# Patient Record
Sex: Male | Born: 1980 | Race: White | Hispanic: No | Marital: Married
Health system: Southern US, Community
[De-identification: ages and names within clinical notes are randomized; demographics above are authoritative.]

## PROBLEM LIST (undated history)

## (undated) DIAGNOSIS — F419 Anxiety disorder, unspecified: Secondary | ICD-10-CM

## (undated) DIAGNOSIS — E119 Type 2 diabetes mellitus without complications: Secondary | ICD-10-CM

## (undated) DIAGNOSIS — I10 Essential (primary) hypertension: Secondary | ICD-10-CM

## (undated) DIAGNOSIS — F319 Bipolar disorder, unspecified: Secondary | ICD-10-CM

## (undated) DIAGNOSIS — F32A Depression, unspecified: Secondary | ICD-10-CM

## (undated) HISTORY — PX: APPENDECTOMY: SHX54

## (undated) HISTORY — PX: COLONOSCOPY: SHX174

## (undated) HISTORY — DX: Anxiety disorder, unspecified: F41.9

## (undated) HISTORY — PX: SHOULDER ARTHROSCOPY: SHX128

## (undated) HISTORY — PX: CHOLECYSTECTOMY: SHX55

## (undated) HISTORY — PX: ARTHROSCOPIC REPAIR ACL: SUR80

## (undated) HISTORY — DX: Depression, unspecified: F32.A

---

## 2021-07-08 ENCOUNTER — Encounter (HOSPITAL_COMMUNITY): Payer: Self-pay

## 2021-07-08 ENCOUNTER — Emergency Department (HOSPITAL_COMMUNITY): Payer: Self-pay

## 2021-07-08 ENCOUNTER — Other Ambulatory Visit: Payer: Self-pay

## 2021-07-08 ENCOUNTER — Emergency Department (HOSPITAL_COMMUNITY)
Admission: EM | Admit: 2021-07-08 | Discharge: 2021-07-08 | Disposition: A | Discharge status: Home / Self Care | Payer: Self-pay | Attending: Emergency Medicine | Admitting: Emergency Medicine

## 2021-07-08 DIAGNOSIS — M25511 Pain in right shoulder: Secondary | ICD-10-CM | POA: Insufficient documentation

## 2021-07-08 DIAGNOSIS — T148XXA Other injury of unspecified body region, initial encounter: Secondary | ICD-10-CM

## 2021-07-08 DIAGNOSIS — W540XXA Bitten by dog, initial encounter: Secondary | ICD-10-CM | POA: Insufficient documentation

## 2021-07-08 DIAGNOSIS — S61212A Laceration without foreign body of right middle finger without damage to nail, initial encounter: Secondary | ICD-10-CM | POA: Insufficient documentation

## 2021-07-08 DIAGNOSIS — Z23 Encounter for immunization: Secondary | ICD-10-CM | POA: Insufficient documentation

## 2021-07-08 DIAGNOSIS — G8929 Other chronic pain: Secondary | ICD-10-CM | POA: Insufficient documentation

## 2021-07-08 MED ORDER — TETANUS-DIPHTH-ACELL PERTUSSIS 5-2.5-18.5 LF-MCG/0.5 IM SUSY
0.5000 mL | PREFILLED_SYRINGE | Freq: Once | INTRAMUSCULAR | Status: AC
  Administered 2021-07-08: 0.5 mL via INTRAMUSCULAR
  Filled 2021-07-08: qty 0.5

## 2021-07-08 MED ORDER — AMOXICILLIN-POT CLAVULANATE 875-125 MG PO TABS: 1.0000 | ORAL_TABLET | Freq: Two times a day (BID) | ORAL | 0 refills | Status: DC

## 2021-07-08 MED ORDER — LIDOCAINE HCL (PF) 1 % IJ SOLN
5.0000 mL | Freq: Once | INTRAMUSCULAR | Status: AC
  Administered 2021-07-08: 5 mL
  Filled 2021-07-08: qty 30

## 2021-07-08 NOTE — ED Provider Notes (Signed)
[facility]-EMERGENCY DEPT Provider Note   CSN: [id number] Arrival date & time: 07/08/21  1958     History Chief Complaint  Patient presents with   Laceration   Animal Bite    Jonathon Warren is a 40 y.o. male with a past medical history who presents for evaluation of laceration.  Occurred just PTA.  Located to right middle finger.  Occurred as he was trying to break up his dogs which were fighting.  He has #2 lacerations.  No active bleeding.  No paresthesias, warm.  Full range of motion.  Last tetanus greater than 80 years old.  Dog is up-to-date on rabies.  In addition he also states he has had some pain to his right shoulder over the last 3 weeks after getting injection.  No fever, redness, warmth.  Pain worse with movement.  No midline neck pain, no traumatic injury.  Denies additional aggravating or alleviating factors.  History obtained from patient and past medical records.  No interpreter used  HPI     History reviewed. No pertinent past medical history.  There are no problems to display for this patient.   History reviewed    History reviewed. No pertinent family history.     Home Medications Prior to Admission medications   Medication Sig Start Date End Date Taking? Authorizing Provider  amoxicillin-clavulanate (AUGMENTIN) 875-125 MG tablet Take 1 tablet by mouth every 12 (twelve) hours. 07/08/21  Yes Ellaina Schuler A, PA-C    Allergies    Patient has no allergy information on record.  Review of Systems   Review of Systems  Constitutional: Negative.   HENT: Negative.    Respiratory: Negative.    Cardiovascular: Negative.   Gastrointestinal: Negative.   Genitourinary: Negative.   Musculoskeletal: Negative.        Right shoulder pain  Skin:  Positive for wound.  Neurological: Negative.   All other systems reviewed and are negative.  Physical Exam Updated Vital Signs BP (!) 138/93 (BP Location: Left Arm)   Pulse 79   Temp 98.6  F (37 C) (Oral)   Resp 15   Ht 6\' 1"  (1.854 m)   Wt 131.5 kg   SpO2 97%   BMI 38.26 kg/m   Physical Exam Vitals and nursing note reviewed.  Constitutional:      General: He is not in acute distress.    Appearance: He is well-developed. He is not ill-appearing, toxic-appearing or diaphoretic.  HENT:     Head: Atraumatic.  Eyes:     Pupils: Pupils are equal, round, and reactive to light.  Cardiovascular:     Rate and Rhythm: Normal rate and regular rhythm.     Pulses:          Radial pulses are 2+ on the right side and 2+ on the left side.  Pulmonary:     Effort: Pulmonary effort is normal. No respiratory distress.  Abdominal:     General: There is no distension.     Palpations: Abdomen is soft.  Musculoskeletal:        General: Tenderness (right shoudler pain with ROM) present. Normal range of motion.     Cervical back: Normal range of motion and neck supple.     Comments: Full rom digits, decreased rom right shoulder secondary to pain  Skin:    General: Skin is warm and dry.     Comments: 2 cm gapping laceration between MCP and PIP, gapping. 1 cm closed laceration to lateral aspect  third digit. No active bleeding  Neurological:     General: No focal deficit present.     Mental Status: He is alert and oriented to person, place, and time.     Sensory: No sensory deficit.     Motor: No weakness.    ED Results / Procedures / Treatments   Labs (all labs ordered are listed, but only abnormal results are displayed) Labs Reviewed - No data to display  EKG None  Radiology DG Shoulder Right  Result Date: 07/08/2021 CLINICAL DATA:  Dog bite. EXAM: RIGHT SHOULDER - 2+ VIEW COMPARISON:  None. FINDINGS: There is no evidence of fracture or dislocation. There is no evidence of arthropathy or other focal bone abnormality. There is no radiopaque foreign body. Soft tissues are unremarkable. IMPRESSION: Negative. Electronically Signed   By: Darliss Cheney M.D.   On: 07/08/2021 20:58    DG Finger Middle Right  Result Date: 07/08/2021 CLINICAL DATA:  Dog bite. EXAM: RIGHT MIDDLE FINGER 2+V COMPARISON:  None. FINDINGS: There is no evidence of fracture or dislocation. There is no evidence of arthropathy or other focal bone abnormality. There is no radiopaque foreign body. Soft tissues are unremarkable. IMPRESSION: Negative. Electronically Signed   By: Darliss Cheney M.D.   On: 07/08/2021 20:58    Procedures .Marland KitchenLaceration Repair  Date/Time: 07/08/2021 9:59 PM Performed by: Ralph Leyden A, PA-C Authorized by: Linwood Dibbles, PA-C   Consent:    Consent obtained:  Verbal   Consent given by:  Patient   Risks, benefits, and alternatives were discussed: yes     Risks discussed:  Infection, pain, retained foreign body, tendon damage, vascular damage, poor wound healing, poor cosmetic result, need for additional repair and nerve damage   Alternatives discussed:  No treatment, delayed treatment, observation and referral Universal protocol:    Procedure explained and questions answered to patient or proxy's satisfaction: yes     Relevant documents present and verified: yes     Test results available: yes     Imaging studies available: yes     Required blood products, implants, devices, and special equipment available: yes     Site/side marked: yes     Immediately prior to procedure, a time out was called: yes     Patient identity confirmed:  Verbally with patient Anesthesia:    Anesthesia method:  Local infiltration   Local anesthetic:  Lidocaine 1% w/o epi Laceration details:    Location:  Finger   Length (cm):  2   Depth (mm):  4 Pre-procedure details:    Preparation:  Patient was prepped and draped in usual sterile fashion and imaging obtained to evaluate for foreign bodies Exploration:    Limited defect created (wound extended): no     Hemostasis achieved with:  Direct pressure   Imaging obtained: x-ray     Imaging outcome: foreign body not noted     Wound  exploration: wound explored through full range of motion and entire depth of wound visualized     Wound extent: no fascia violation noted, no foreign bodies/material noted, no muscle damage noted, no nerve damage noted, no tendon damage noted, no underlying fracture noted and no vascular damage noted     Contaminated: no   Treatment:    Area cleansed with:  Povidone-iodine   Amount of cleaning:  Extensive   Irrigation solution:  Sterile saline   Irrigation method:  Pressure wash Skin repair:    Repair method:  Sutures   Suture size:  4-0   Suture material:  Prolene   Suture technique:  Simple interrupted   Number of sutures:  2 Approximation:    Approximation:  Loose Repair type:    Repair type:  Intermediate Post-procedure details:    Dressing:  Non-adherent dressing   Procedure completion:  Tolerated well, no immediate complications   Medications Ordered in ED Medications  Tdap (BOOSTRIX) injection 0.5 mL (0.5 mLs Intramuscular Given 07/08/21 2052)  lidocaine (PF) (XYLOCAINE) 1 % injection 5 mL (5 mLs Infiltration Given by Other 07/08/21 2053)   ED Course  I have reviewed the triage vital signs and the nursing notes.  Pertinent labs & imaging results that were available during my care of the patient were reviewed by me and considered in my medical decision making (see chart for details).  Here for evaluation of dog bite to third digit RUE volar surface, gapping 2 cm, no active bleeding. Additional 1 cm, approximated laceration lateral aspect finger.  Imaging obtained does not show evidence of retained foreign object.  He has full range of motion, neurovascularly intact.  Low suspicion for acute bony, tendon, ligament, muscle injury.  Due to gaping area and laceration placed to, loose sutures to allow for drainage.  Wounds were thoroughly irrigated prior.  We will keep second like open to allow for drainage given contaminated dog bite.  Patient also does note some left shoulder  pain over the last 3 weeks after getting shot.  Pain worse with movement.  No fevers, overlying redness, warmth, no numbness or weakness.  X-ray here does not show evidence of fracture, dislocation or effusion.  Low suspicion for gout, septic joint, hemarthrosis.  We will have him follow-up with Ortho outpatient.  He is agreeable.   MDM Rules/Calculators/A&P                            Final Clinical Impression(s) / ED Diagnoses Final diagnoses:  Animal bite  Laceration of right middle finger without foreign body without damage to nail, initial encounter  Chronic right shoulder pain    Rx / DC Orders ED Discharge Orders          Ordered    amoxicillin-clavulanate (AUGMENTIN) 875-125 MG tablet  Every 12 hours        07/08/21 2145             Jonathandavid Marlett A, PA-C 07/08/21 2202    Maia Plan, MD 07/08/21 2222

## 2021-07-08 NOTE — ED Triage Notes (Signed)
Pt reports with a laceration/dog bite to his right middle finger. Pt was breaking up his dogs fighting and was bitten around 1600.

## 2021-07-08 NOTE — Discharge Instructions (Addendum)
It was our pleasure taking care of you here in the emergency department.  Make sure to keep your wounds clean for the first 24 hours, after this may use warm soapy water to rinse over these  Follow-up with orthopedics for her shoulder.  I have placed the contact number to one in your discharge paperwork.  Call to schedule an appointment

## 2021-08-23 ENCOUNTER — Encounter (HOSPITAL_COMMUNITY): Payer: Self-pay | Admitting: Registered Nurse

## 2021-08-23 ENCOUNTER — Other Ambulatory Visit (HOSPITAL_COMMUNITY)
Admission: EM | Admit: 2021-08-23 | Discharge: 2021-08-24 | Disposition: A | Discharge status: Home / Self Care | Payer: PRIVATE HEALTH INSURANCE | Attending: Psychiatry | Admitting: Psychiatry

## 2021-08-23 ENCOUNTER — Other Ambulatory Visit: Payer: Self-pay

## 2021-08-23 DIAGNOSIS — F410 Panic disorder [episodic paroxysmal anxiety] without agoraphobia: Secondary | ICD-10-CM | POA: Insufficient documentation

## 2021-08-23 DIAGNOSIS — Z20822 Contact with and (suspected) exposure to covid-19: Secondary | ICD-10-CM | POA: Diagnosis not present

## 2021-08-23 DIAGNOSIS — F3160 Bipolar disorder, current episode mixed, unspecified: Secondary | ICD-10-CM | POA: Diagnosis present

## 2021-08-23 LAB — RESP PANEL BY RT-PCR (FLU A&B, COVID) ARPGX2
Influenza A by PCR: NEGATIVE
Influenza B by PCR: NEGATIVE
SARS Coronavirus 2 by RT PCR: NEGATIVE

## 2021-08-23 LAB — CBC WITH DIFFERENTIAL/PLATELET
Abs Immature Granulocytes: 0.03 10*3/uL (ref 0.00–0.07)
Basophils Absolute: 0 10*3/uL (ref 0.0–0.1)
Basophils Relative: 0 %
Eosinophils Absolute: 0.2 10*3/uL (ref 0.0–0.5)
Eosinophils Relative: 2 %
HCT: 41.6 % (ref 39.0–52.0)
Hemoglobin: 14.2 g/dL (ref 13.0–17.0)
Immature Granulocytes: 0 %
Lymphocytes Relative: 30 %
Lymphs Abs: 2.9 10*3/uL (ref 0.7–4.0)
MCH: 28.8 pg (ref 26.0–34.0)
MCHC: 34.1 g/dL (ref 30.0–36.0)
MCV: 84.4 fL (ref 80.0–100.0)
Monocytes Absolute: 0.7 10*3/uL (ref 0.1–1.0)
Monocytes Relative: 7 %
Neutro Abs: 5.9 10*3/uL (ref 1.7–7.7)
Neutrophils Relative %: 61 %
Platelets: 290 10*3/uL (ref 150–400)
RBC: 4.93 MIL/uL (ref 4.22–5.81)
RDW: 11.9 % (ref 11.5–15.5)
WBC: 9.9 10*3/uL (ref 4.0–10.5)
nRBC: 0 % (ref 0.0–0.2)

## 2021-08-23 LAB — URINALYSIS, ROUTINE W REFLEX MICROSCOPIC
Bilirubin Urine: NEGATIVE
Glucose, UA: NEGATIVE mg/dL
Hgb urine dipstick: NEGATIVE
Ketones, ur: 40 mg/dL — AB
Leukocytes,Ua: NEGATIVE
Nitrite: NEGATIVE
Protein, ur: NEGATIVE mg/dL
Specific Gravity, Urine: 1.02 (ref 1.005–1.030)
pH: 6.5 (ref 5.0–8.0)

## 2021-08-23 LAB — COMPREHENSIVE METABOLIC PANEL
ALT: 61 U/L — ABNORMAL HIGH (ref 0–44)
AST: 26 U/L (ref 15–41)
Albumin: 3.8 g/dL (ref 3.5–5.0)
Alkaline Phosphatase: 37 U/L — ABNORMAL LOW (ref 38–126)
Anion gap: 9 (ref 5–15)
BUN: 27 mg/dL — ABNORMAL HIGH (ref 6–20)
CO2: 26 mmol/L (ref 22–32)
Calcium: 9.2 mg/dL (ref 8.9–10.3)
Chloride: 100 mmol/L (ref 98–111)
Creatinine, Ser: 1.1 mg/dL (ref 0.61–1.24)
GFR, Estimated: >60 mL/min (ref >60)
Glucose, Bld: 160 mg/dL — ABNORMAL HIGH (ref 70–99)
Potassium: 3.6 mmol/L (ref 3.5–5.1)
Sodium: 135 mmol/L (ref 135–145)
Total Bilirubin: 0.6 mg/dL (ref 0.3–1.2)
Total Protein: 6.6 g/dL (ref 6.5–8.1)

## 2021-08-23 LAB — LIPID PANEL
Cholesterol: 132 mg/dL (ref 0–200)
HDL: 36 mg/dL — ABNORMAL LOW (ref >40)
LDL Cholesterol: 46 mg/dL (ref 0–99)
Total CHOL/HDL Ratio: 3.7 RATIO
Triglycerides: 248 mg/dL — ABNORMAL HIGH (ref <150)
VLDL: 50 mg/dL — ABNORMAL HIGH (ref 0–40)

## 2021-08-23 LAB — HEMOGLOBIN A1C
Hgb A1c MFr Bld: 6.7 % — ABNORMAL HIGH (ref 4.8–5.6)
Mean Plasma Glucose: 145.59 mg/dL

## 2021-08-23 LAB — POCT URINE DRUG SCREEN - MANUAL ENTRY (I-SCREEN)
POC Amphetamine UR: NOT DETECTED
POC Buprenorphine (BUP): NOT DETECTED
POC Cocaine UR: NOT DETECTED
POC Marijuana UR: NOT DETECTED
POC Methadone UR: NOT DETECTED
POC Methamphetamine UR: NOT DETECTED
POC Morphine: NOT DETECTED
POC Oxazepam (BZO): NOT DETECTED
POC Oxycodone UR: NOT DETECTED
POC Secobarbital (BAR): NOT DETECTED

## 2021-08-23 LAB — ETHANOL: Alcohol, Ethyl (B): <10 mg/dL (ref <10)

## 2021-08-23 LAB — POC SARS CORONAVIRUS 2 AG: SARSCOV2ONAVIRUS 2 AG: NEGATIVE

## 2021-08-23 LAB — MAGNESIUM: Magnesium: 1.7 mg/dL (ref 1.7–2.4)

## 2021-08-23 LAB — TSH: TSH: 1.789 u[IU]/mL (ref 0.350–4.500)

## 2021-08-23 MED ORDER — MAGNESIUM HYDROXIDE 400 MG/5ML PO SUSP: 30.0000 mL | Freq: Every day | ORAL | Status: DC | PRN

## 2021-08-23 MED ORDER — HYDROXYZINE HCL 25 MG PO TABS
25.0000 mg | ORAL_TABLET | Freq: Three times a day (TID) | ORAL | Status: DC | PRN
  Administered 2021-08-23 – 2021-08-24 (×2): 25 mg via ORAL
  Filled 2021-08-23 (×2): qty 1

## 2021-08-23 MED ORDER — ALBUTEROL SULFATE HFA 108 (90 BASE) MCG/ACT IN AERS: 2.0000 | INHALATION_SPRAY | Freq: Four times a day (QID) | RESPIRATORY_TRACT | Status: DC | PRN

## 2021-08-23 MED ORDER — LOSARTAN POTASSIUM-HCTZ 100-25 MG PO TABS: 1.0000 | ORAL_TABLET | Freq: Every day | ORAL | Status: DC

## 2021-08-23 MED ORDER — HYDROCHLOROTHIAZIDE 25 MG PO TABS
25.0000 mg | ORAL_TABLET | Freq: Every day | ORAL | Status: DC
  Administered 2021-08-24: 25 mg via ORAL
  Filled 2021-08-23: qty 1

## 2021-08-23 MED ORDER — ACETAMINOPHEN 325 MG PO TABS
650.0000 mg | ORAL_TABLET | Freq: Four times a day (QID) | ORAL | Status: DC | PRN
  Administered 2021-08-24: 650 mg via ORAL
  Filled 2021-08-23: qty 2

## 2021-08-23 MED ORDER — BUSPIRONE HCL 5 MG PO TABS: 10.0000 mg | ORAL_TABLET | Freq: Two times a day (BID) | ORAL | Status: DC | PRN

## 2021-08-23 MED ORDER — TRAZODONE HCL 100 MG PO TABS
200.0000 mg | ORAL_TABLET | Freq: Every day | ORAL | Status: DC
  Administered 2021-08-23: 200 mg via ORAL
  Filled 2021-08-23: qty 2

## 2021-08-23 MED ORDER — METOPROLOL TARTRATE 50 MG PO TABS
50.0000 mg | ORAL_TABLET | Freq: Two times a day (BID) | ORAL | Status: DC
  Administered 2021-08-23 – 2021-08-24 (×2): 50 mg via ORAL
  Filled 2021-08-23 (×2): qty 1

## 2021-08-23 MED ORDER — ALUM & MAG HYDROXIDE-SIMETH 200-200-20 MG/5ML PO SUSP: 30.0000 mL | ORAL | Status: DC | PRN

## 2021-08-23 MED ORDER — LOSARTAN POTASSIUM 50 MG PO TABS
100.0000 mg | ORAL_TABLET | Freq: Every day | ORAL | Status: DC
  Administered 2021-08-24: 100 mg via ORAL
  Filled 2021-08-23: qty 2

## 2021-08-23 MED ORDER — PAROXETINE HCL 20 MG PO TABS
20.0000 mg | ORAL_TABLET | Freq: Two times a day (BID) | ORAL | Status: DC
Start: 2021-08-23 — End: 2021-08-24
  Administered 2021-08-23 – 2021-08-24 (×2): 20 mg via ORAL
  Filled 2021-08-23 (×2): qty 1

## 2021-08-23 MED ORDER — METFORMIN HCL 500 MG PO TABS
1000.0000 mg | ORAL_TABLET | Freq: Every day | ORAL | Status: DC
  Administered 2021-08-23: 1000 mg via ORAL
  Filled 2021-08-23: qty 2

## 2021-08-23 MED ORDER — AMLODIPINE BESYLATE 5 MG PO TABS
5.0000 mg | ORAL_TABLET | Freq: Every day | ORAL | Status: DC
Start: 2021-08-24 — End: 2021-08-24
  Administered 2021-08-24: 5 mg via ORAL
  Filled 2021-08-23: qty 1

## 2021-08-23 MED ORDER — CLONAZEPAM 1 MG PO TABS
1.0000 mg | ORAL_TABLET | Freq: Once | ORAL | Status: AC
  Administered 2021-08-23: 1 mg via ORAL
  Filled 2021-08-23: qty 1

## 2021-08-23 NOTE — BH Assessment (Signed)
Comprehensive Clinical Assessment (CCA) Note  08/23/2021 Jonathon Warren [id number]  Disposition: Per [name], NP admission to Facility Based Crisis unit is recommended for crisis stabilization.   The patient demonstrates the following risk factors for suicide: Chronic risk factors for suicide include: psychiatric disorder of Bipolar I Disorder and chronic pain. Acute risk factors for suicide include: N/A. Protective factors for this patient include: positive social support, responsibility to others (children, family), coping skills, hope for the future, and life satisfaction. Considering these factors, the overall suicide risk at this point appears to be low. Patient is appropriate for outpatient follow up.  Patient is a 41 year old male with a history of Bipolar I Disorder who presents voluntarily with his wife to [facility] for assessment.  He reports extreme anxiety and panic attacks for the past few days.  He is diagnosed with Bipolar and and is Rx Lamictal, Paxil, Buspar, Klonopin and Trazadone.  He is medication  compliant and medications are managed by his PCP with [facility].  He had been seeing a psychiatrist while living in [place].  When he moved, his PCP was willing to continue medications as previously prescribed. Patient missed a few lamictal doses, due to insurance issues with refilling.  He has since been back on it for the past few days.  He has not had an episode like this since his admission to [facility] 7 years ago.  He struggles to identify a trigger, outside of work related stress.  He is an Therapist, sports at [employer].  Patient is SOB and quite anxious, asking his wife to answer questions as needed. Patient is feeling overwhelmed and describes feeling "terrified" at the idea of going back to work.   Patient is fearful that this episode will worsen to the point his last episode got to. This fear has been "crippling" and he is struggling to function.   Patient denies SI, HI and AVH.  He denies SA hx.  He is open to treatment recommendiatons, to include inpatient treatment.  Patient's wife is very supportive and provides some collateral.  She confirms that patient missed a few days of mood stabiizer, however she feels there trigger may have been related to patient having a needle break in his arm when flu shot was administered at work in Nov 2022.  He has since had related pain and frustrated that his employer isn't considering workers compensation for the injury.  As a result, he has not had this piece of the needle removed, resulting in limited mobility with right arm due to related pain.  His wife has noticed worsening symptoms with significant anxiety and mood lability since.  She is supportive of treatment recommendations.    Chief Complaint: No chief complaint on file.  Visit Diagnosis: Bipolar I Disorder  Flowsheet Row ED from 08/23/2021 in [facility]  Thoughts that you would be better off dead, or of hurting yourself in some way Not at all  PHQ-9 Total Score 11      [place] ED from 07/08/2021 in [place] DEPT  C-SSRS RISK CATEGORY No Risk        CCA Screening, Triage and Referral (STR)  Patient Reported Information How did you hear about [name]? Family/Friend  What Is the Reason for Your Visit/Call Today? Patient presents with wife for assessment.  He reports extreme anxiety and panic attacks for the past few days.  He is diagnosed with Bipolar and and is  Rx Lamictal.  He was off for a couple of days, but has been back on it for the past few days.  He has not had an episode like this since his admission to [facility] 7 years ago.  Patient is SOB and quite anxious, asking his wife to answer questions as needed. Patient is feeling overwhelmed and describes feeling "terrified" at the idea of going back to work.  He is an Therapist, sports at [employer].  Patient is  fearful that this episode will worsen to the point his last episode got to. This fear has been "crippling" and he is struggling to function.  Patient denies SI, HI and AVH.  He denies SA hx.  He is open to treatment recommendiatons, to include inpatient.  How Long Has This Been Causing You Problems? <Week  What Do You Feel Would Help You the Most Today? Treatment for Depression or other mood problem   Have You Recently Had Any Thoughts About Hurting Yourself? No  Are You Planning to Commit Suicide/Harm Yourself At This time? No   Have you Recently Had Thoughts About [place]? No  Are You Planning to Harm Someone at This Time? No  Explanation: No data recorded  Have You Used Any Alcohol or Drugs in the Past 24 Hours? No  How Long Ago Did You Use Drugs or Alcohol? No data recorded What Did You Use and How Much? No data recorded  Do You Currently Have a Therapist/Psychiatrist? No  Name of Therapist/Psychiatrist: No data recorded  Have You Been Recently Discharged From Any Office Practice or Programs? No  Explanation of Discharge From Practice/Program: No data recorded    CCA Screening Triage Referral Assessment Type of Contact: Face-to-Face  Telemedicine Service Delivery:   Is this Initial or Reassessment? No data recorded Date Telepsych consult ordered in [facility]:  No data recorded Time Telepsych consult ordered in [facility]:  No data recorded Location of Assessment: [facility] Assessment Services  Provider Location: [facility] Assessment Services   Collateral Involvement: Wife provided collateral   Does Patient Have a Newport? No data recorded Name and Contact of Legal Guardian: No data recorded If Minor and Not Living with Parent(s), Who has Custody? No data recorded Is CPS involved or ever been involved? Never  Is APS involved or ever been involved? Never   Patient Determined To Be At Risk for Harm To Self or Others Based on Review of Patient  Reported Information or Presenting Complaint? No  Method: No data recorded Availability of Means: No data recorded Intent: No data recorded Notification Required: No data recorded Additional Information for Danger to Others Potential: No data recorded Additional Comments for Danger to Others Potential: No data recorded Are There Guns or Other Weapons in Your Home? No data recorded Types of Guns/Weapons: No data recorded Are These Weapons Safely Secured?                            No data recorded Who Could Verify You Are Able To Have These Secured: No data recorded Do You Have any Outstanding Charges, Pending Court Dates, Parole/Probation? No data recorded Contacted To Inform of Risk of Harm To Self or Others: No data recorded   Does Patient Present under Involuntary Commitment? No  IVC Papers Initial File Date: No data recorded  [place] of Residence: [place]   Patient Currently Receiving the Following Services: Not Receiving Services  Determination of Need: Urgent (48 hours)   Options For Referral: Intensive Outpatient Therapy; Inpatient Hospitalization; Medication Management; Outpatient Therapy     CCA Biopsychosocial Patient Reported Schizophrenia/Schizoaffective Diagnosis in Past: No   Strengths: Seeking treatment, has support   Mental Health Symptoms Depression:   Difficulty Concentrating; Fatigue; Hopelessness; Worthlessness; Increase/decrease in appetite; Sleep (too much or little)   Duration of Depressive symptoms:  Duration of Depressive Symptoms: Less than two weeks   Mania:   Irritability; Racing thoughts   Anxiety:    Restlessness; Sleep; Tension; Worrying; Fatigue; Difficulty concentrating; Irritability   Psychosis:   None   Duration of Psychotic symptoms:    Trauma:   None   Obsessions:   None   Compulsions:   None   Inattention:   N/A   Hyperactivity/Impulsivity:   N/A   Oppositional/Defiant Behaviors:   N/A   Emotional  Irregularity:   Mood lability; Chronic feelings of emptiness   Other Mood/Personality Symptoms:  No data recorded   Mental Status Exam Appearance and self-care  Stature:   Average   Weight:   Average weight   Clothing:   Casual   Grooming:   Normal   Cosmetic use:   None   Posture/gait:   Tense   Motor activity:   Restless   Sensorium  Attention:   Distractible; Normal   Concentration:   Anxiety interferes; Variable   Orientation:   X5   Recall/memory:   Normal   Affect and Mood  Affect:   Depressed; Labile   Mood:   Irritable; Negative; Depressed; Anxious   Relating  Eye contact:   Fleeting   Facial expression:  No data recorded  Attitude toward examiner:  No data recorded  Thought and Language  Speech flow:  Clear and Coherent; Paucity; Pressured   Thought content:   Appropriate to Mood and Circumstances   Preoccupation:   None   Hallucinations:   None   Organization:  No data recorded  [employer] of Knowledge:   Average   Intelligence:   Average   Abstraction:   Normal   Judgement:   Fair   Reality Testing:   Adequate   Insight:   Gaps   Decision Making:   Impulsive; Vacilates; Normal   Social Functioning  Social Maturity:   Responsible   Social Judgement:   Normal   Stress  Stressors:   Work   Coping Ability:   Exhausted; Overwhelmed   Skill Deficits:   Interpersonal; Responsibility; Self-control; Communication   Supports:   Family; Friends/Service system     Religion: Religion/Spirituality Are You A Religious Person?: No  Leisure/Recreation: Leisure / Recreation Do You Have Hobbies?: No  Exercise/Diet: Exercise/Diet Do You Exercise?: No Have You Gained or Lost A Significant Amount of Weight in the Past Six Months?: No Do You Follow a Special Diet?: No Do You Have Any Trouble Sleeping?: Yes Explanation of Sleeping Difficulties: Unable to estimate, however states he isn't  sleeping well even with trazadone   CCA Employment/Education Employment/Work Situation:    Education: Education Is Patient Currently Attending School?: No Last Grade Completed: 16 Did You Attend College?: Yes What Type of College Degree Do you Have?: Patient has a nursing degree Did You Have An Individualized Education Program (IIEP): No Did You Have Any Difficulty At School?: No Patient's Education Has Been Impacted by Current Illness: No   CCA Family/Childhood History Family and Relationship History: Family history Marital status: Married Number of Years Married:  (  NA) What types of issues is patient dealing with in the relationship?: No concerns noted Additional relationship information: NA Does patient have children?: Yes How many children?:  (NA - has kids and step-children)  Childhood History:  Childhood History By whom was/is the patient raised?: Mother, Father Did patient suffer any verbal/emotional/physical/sexual abuse as a child?: No Did patient suffer from severe childhood neglect?: No Has patient ever been sexually abused/assaulted/raped as an adolescent or adult?: No Was the patient ever a victim of a crime or a disaster?: No Witnessed domestic violence?: No Has patient been affected by domestic violence as an adult?: No  Child/Adolescent Assessment:     CCA Substance Use Alcohol/Drug Use: Alcohol / Drug Use Pain Medications: See MAR Prescriptions: See MAR Over the Counter: See MAR History of alcohol / drug use?: No history of alcohol / drug abuse                         ASAM's:  Six Dimensions of Multidimensional Assessment  Dimension 1:  Acute Intoxication and/or Withdrawal Potential:      Dimension 2:  Biomedical Conditions and Complications:      Dimension 3:  Emotional, Behavioral, or Cognitive Conditions and Complications:     Dimension 4:  Readiness to Change:     Dimension 5:  Relapse, Continued use, or Continued Problem  Potential:     Dimension 6:  Recovery/Living Environment:     ASAM Severity Score:    ASAM Recommended Level of Treatment:     Substance use Disorder (SUD)    Recommendations for Services/Supports/Treatments:    Discharge Disposition:    DSM5 Diagnoses: Patient Active Problem List   Diagnosis Date Noted   Bipolar I disorder, most recent episode mixed ([place]) 08/23/2021   Bipolar affective disorder, current episode mixed, without psychotic features ([place]) 08/23/2021     Referrals to Alternative Service(s):  [facility], [facility]

## 2021-08-23 NOTE — ED Notes (Signed)
Pt is currently sleeping, no distress noted, environmental check complete, will continue to monitor patient for safety. ? ?

## 2021-08-23 NOTE — Progress Notes (Signed)
Patient admitted to obs unit until COVID test results. Patient  is pleasant, logical in speech and thought. Patient denies SI, HI and AVH. Skin is WNL, only several tattoos present on right arm and left leg. Nursing staff will continue to monitor.

## 2021-08-23 NOTE — ED Notes (Signed)
Pt is in the dining room attending the AA meeting that is taking place

## 2021-08-23 NOTE — ED Notes (Signed)
Pts medication have been given and patient is now in bed

## 2021-08-23 NOTE — Progress Notes (Addendum)
Pt was transferred from [facility]. Pt is admitted to [facility] due to anxiety and panic attacks. Pt is alert and oriented with a blunted affect. Pt is ambulatory and is oriented to staff and unit. Pt was cooperative with admission process and skin assessment. Pt complained of back and arm pain on a scale of 7. Pt denies current SI/HI/AVH. 15 minutes checks for safety initiated. Staff will monitor for pt's safety.

## 2021-08-23 NOTE — ED Notes (Signed)
Pt. Spoke to me about transferring to another treatment center and requested to speak with a social worker in the AM. About the switch. I told him to ask for after 8AM.

## 2021-08-23 NOTE — ED Notes (Signed)
Pt in dayroom attending AA meeting

## 2021-08-23 NOTE — Progress Notes (Signed)
°   08/23/21 0806  [facility] Triage Screening (Walk-ins at [facility] only)  How Did You Hear About [name]? Family/Friend  What Is the Reason for Your Visit/Call Today? Patient presents with wife for assessment.  He reports extreme anxiety and panic attacks for the past few days.  He is diagnosed with Bipolar and and is Rx Lamictal.  He was off for a couple of days, but has been back on it for the past few days.  He has not had an episode like this since his admission to [facility] 7 years ago.  Patient is SOB and quite anxious, asking his wife to answer questions as needed. Patient is feeling overwhelmed and describes feeling "terrified" at the idea of going back to work.  He is an Charity fundraiser at [employer]  Patient is fearful that this episode will worsen to the point his last episode got to. This fear has been "crippling" and he is struggling to function.  Patient denies SI, HI and AVH.  He denies SA hx.  He is open to treatment recommendiatons, to include inpatient.  How Long Has This Been Causing You Problems? <Week  Have You Recently Had Any Thoughts About Hurting Yourself? No  Are You Planning to Commit Suicide/Harm Yourself At This time? No  Have you Recently Had Thoughts About Hurting Someone Jonathon Warren? No  Are You Planning To Harm Someone At This Time? No  Are you currently experiencing any auditory, visual or other hallucinations? No  Have You Used Any Alcohol or Drugs in the Past 24 Hours? No  Do you have any current medical co-morbidities that require immediate attention? No  Clinician description of patient physical appearance/behavior: Patient is anxious, eyes closed during deep breathing, overall coopertive  What Do You Feel Would Help You the Most Today? Treatment for Depression or other mood problem  If access to [facility] was not available, would you have sought care in the Emergency Department? No  Determination of Need Urgent (48 hours)  Options For Referral Intensive Outpatient  Therapy;Inpatient Hospitalization;Medication Management;Outpatient Therapy

## 2021-08-23 NOTE — ED Provider Notes (Signed)
Behavioral Health Admission [facility] & OBS)  Date: 08/23/21 Patient Name: Jonathon Warren MRN: [id number] Chief Complaint: No chief complaint on file.     Diagnoses:  Final diagnoses:  Bipolar I disorder, most recent episode mixed [facility])    HPI: Jonathon Warren, 41 y.o., male patient presents to [facility] as a walk-in accompanied by his wife with complaints of anxiety and panic attacks that are worsening of the last few days.  Patient seen face to face by this provider, consulted with Dr. Ernie Hew; and chart reviewed on 08/23/21.  On evaluation Jonathon Warren reports psychiatric history of bipolar disorder.  Reports medication management is done by his primary care doctor at this time; states he does not have any outpatient psychiatric services currently.  Patient reports he missed a couple of days of his psychotropic medications but has been back on his regular schedule for the last couple of days.  Patient reports he has been feeling very anxious, having manic thoughts, crippling fear, and panic attacks.  Reports he feels as if the incident where a needle was broken off in his arm while obtaining a flu shot and then being turned down by [employer]. is what triggered this episode.  Stating he is now terrified to go back to work and has a panic attack when he thinks about going back.  Patient denies suicidal/self-harm/homicidal ideation, and psychosis.  He also denies paranoia, stating that the fear he has toward work he would really say it was paranoia.  Patient reports the last time he felt like this he was admitted to [facility] 7 years ago and is wanting to avoid getting any worse.  Patient is sennoside his wife and both of them seem very co dependent upon each other.  Patient feels he needs psychiatric hospitalization for stabilization but is afraid to leave his wife stating he is needed to keep her stable.  Patient reporting only 1 psychiatric hospitalization 7 years ago.  Denies prior self  harming behaviors or suicide attempt. During evaluation Jonathon Warren is sitting upright in chair in no acute distress.  He is alert/oriented x 4; calm/cooperative.  His mood is labile; 1 minute laughing joking the next that depressed and tearful.  He is speaking in a clear tone at moderate volume, and normal pace; with good eye contact.  His thought process is coherent and relevant; There is no indication that he is currently responding to internal/external stimuli or experiencing delusional thought content; and he denies suicidal/self-harm/homicidal ideation, psychosis, and paranoia.  He does endorse racing thoughts and feeling somewhat manic; also states has not slept well for the last couple of days.  PHQ 2-9:   Flowsheet Row ED from 07/08/2021 in [place] DEPT  C-SSRS RISK CATEGORY No Risk        Total Time spent with patient: 30 minutes  Musculoskeletal  Strength & Muscle Tone: within normal limits Gait & Station: normal Patient leans: N/A  Psychiatric Specialty Exam  Presentation General Appearance: Appropriate for Environment  Eye Contact:Good  Speech:Clear and Coherent; Normal Rate  Speech Volume:Normal  Handedness:Right   Mood and Affect  Mood:Anxious; Labile  Affect:Depressed   Thought Process  Thought Processes:Coherent; Goal Directed  Descriptions of Associations:Intact  Orientation:Full (Time, Place and Person)  Thought Content:Logical    Hallucinations:Hallucinations: None  Ideas of Reference:None  Suicidal Thoughts:Suicidal Thoughts: No  Homicidal Thoughts:Homicidal Thoughts: No   Sensorium  Memory:Immediate Good; Recent Good; Remote Good  Judgment:Intact  Insight:Present   Executive  Functions  Concentration:Good  Attention Span:Good  Recall:Good  Fund of Knowledge:Good  Language:Good   Psychomotor Activity  Psychomotor Activity:Psychomotor Activity: Normal   Assets  Assets:Communication  Skills; Desire for Improvement; Financial Resources/Insurance; Housing; Social Support; Transportation   Sleep  Sleep:Sleep: Fair   Nutritional Assessment (For OBS and [facility] admissions only) Has the patient had a weight loss or gain of 10 pounds or more in the last 3 months?: No Has the patient had a decrease in food intake/or appetite?: No Does the patient have dental problems?: No Does the patient have eating habits or behaviors that may be indicators of an eating disorder including binging or inducing vomiting?: No Has the patient been eating poorly because of a decreased appetite?: 0    Physical Exam ROS  Blood pressure (!) 125/95, pulse 93, temperature (!) 97 F (36.1 C), temperature source Oral, resp. rate 18, height 6\' 1"  (1.854 m), weight 131.5 kg, SpO2 99 %. Body mass index is 38.26 kg/m.  Past Psychiatric History: Bipolar disorder, Anxiety   Is the patient at risk to self? No  Has the patient been a risk to self in the past 6 months? No .    Has the patient been a risk to self within the distant past? No   Is the patient a risk to others? No   Has the patient been a risk to others in the past 6 months? No   Has the patient been a risk to others within the distant past? No   Past Medical History: History reviewed. No pertinent past medical history. History reviewed. No pertinent surgical history.  Family History: History reviewed. No pertinent family history.  Social History:  Social History   Socioeconomic History   Marital status: Married    Spouse name: Not on file   Number of children: Not on file   Years of education: Not on file   Highest education level: Not on file  Occupational History   Not on file  Tobacco Use   Smoking status: Not on file   Smokeless tobacco: Not on file  Substance and Sexual Activity   Alcohol use: Not on file   Drug use: Not on file   Sexual activity: Not on file  Other Topics Concern   Not on file  Social History Narrative    Not on file   Social Determinants of Health   Financial Resource Strain: Not on file  Food Insecurity: Not on file  Transportation Needs: Not on file  Physical Activity: Not on file  Stress: Not on file  Social Connections: Not on file  Intimate Partner Violence: Not on file    SDOH:  SDOH Screenings   Alcohol Screen: Not on file  Depression (PHQ2-9): Not on file  Financial Resource Strain: Not on file  Food Insecurity: Not on file  Housing: Not on file  Physical Activity: Not on file  Social Connections: Not on file  Stress: Not on file  Tobacco Use: Not on file  Transportation Needs: Not on file    Last Labs:  No results found for any previous visit.    Allergies: Patient has no allergy information on record.  PTA Medications: (Not in a hospital admission)   Medical Decision Making  Admitted to facility based crisis unit for safety and stabilization Lab Orders         Resp Panel by RT-PCR (Flu A&B, Covid) Nasopharyngeal Swab         CBC with Differential/Platelet  Comprehensive metabolic panel         Hemoglobin A1c         Magnesium         Ethanol         Lipid panel         TSH         Urinalysis, Routine w reflex microscopic Urine, Clean Catch         POC SARS Coronavirus 2 Ag-ED - Nasal Swab         POCT Urine Drug Screen - (ICup)      Medication Management:  Restart home medications after pharmacy med reconciliation completed.   Social work to assist with setting up outpatient psychiatric follow up services   Recommendations  Based on my evaluation the patient does not appear to have an emergency medical condition.  Raygen Dahm, NP 08/23/21  9:24 AM

## 2021-08-24 LAB — GLUCOSE, CAPILLARY: Glucose-Capillary: 152 mg/dL — ABNORMAL HIGH (ref 70–99)

## 2021-08-24 MED ORDER — QUETIAPINE FUMARATE 100 MG PO TABS: 100.0000 mg | ORAL_TABLET | Freq: Every day | ORAL | 0 refills | Status: DC

## 2021-08-24 MED ORDER — LAMOTRIGINE 25 MG PO TABS: 25.0000 mg | ORAL_TABLET | Freq: Every day | ORAL | Status: DC

## 2021-08-24 MED ORDER — QUETIAPINE FUMARATE 100 MG PO TABS: 100.0000 mg | ORAL_TABLET | Freq: Every day | ORAL | Status: DC

## 2021-08-24 MED ORDER — CLONAZEPAM 1 MG PO TABS
1.0000 mg | ORAL_TABLET | Freq: Three times a day (TID) | ORAL | Status: DC | PRN
  Administered 2021-08-24: 1 mg via ORAL
  Filled 2021-08-24: qty 1

## 2021-08-24 NOTE — Progress Notes (Signed)
Pt was seen by provider multiple times. Pt came to the NS whilst this writer was speaking with the provider to inquire about his Klonopin. Pt was informed by provider and this writer that the medication is PRN. This Clinical research associate further explained that pt will have to ask for PRN. Pt then became hostile, raised his voice and said "I asked for it this morning." This writer had to reiterate that medication was not ordered when he asked for it. Pt was also informed that he was given Vistaril in the morning when the Klonopin was not available. Pt became irate and said " you should have given it to me when it was ordered.' Pt was informed again that PRNs are as needed. Administered PRN Klonopin with no further incident. Will continue to monitor.

## 2021-08-24 NOTE — Progress Notes (Signed)
Pt is awake, alert and oriented. Pt is irritable and labile. Pt complained of back and arm pain. PRN Tylenol ,Vistaril  and scheduled meds administered with no incident. No signs of acute distress noted. Pt has been visible on the milieu and made multiple phone calls. Pt denies current SI/HI/AVH.  Staff will monitor for pt's safety.

## 2021-08-24 NOTE — ED Notes (Signed)
While medication was being administered the pt asked if he could have the medications he takes at home which are klonopin, trazodone, his blood pressure medications and some other medications. The patient was advised that we do not see those medications listed in his MAR. The patient stated he has been here since this morning and he told the pharmacist this morning what medications he is on. This nurse along with Sharyn Lull (LPN) advised the patient that we see the medications listed under the home meds list however they have to be put in his [facility] by the provider and reconciled by pharmacy. 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 The patient was advised that we will reach out to the provider to have him to get the medications ordered for him.

## 2021-08-24 NOTE — Discharge Summary (Addendum)
Jonathon Warren to be D/C'd Home per MD order. Discussed with the patient and all questions fully answered. An After Visit Summary was printed and given to the patient. Pt's belongings were given to him. Patient escorted out and D/C home via private auto.  [name]  08/24/2021 5:00 PM

## 2021-08-24 NOTE — Discharge Instructions (Signed)

## 2021-08-24 NOTE — ED Provider Notes (Signed)
[facility]/OBS ASAP Discharge Summary  Date and Time: 08/24/2021 4:41 PM  Name: Jonathon Warren  MRN:  [id number]   Discharge Diagnoses:  Final diagnoses:  Bipolar I disorder, most recent episode mixed ([place])    Subjective:  Patient seen and chart reviewed.  Patient seen in conjunction today with Education officer, museum.  On chart review patient was requesting to go to a different facility.  Patient interviewed this morning.  Patient states he has been experiencing increased anxiety and panic attacks.  Patient indicates that this is similar to what he was experiencing approximately 7 to 8 years ago when he was diagnosed with bipolar disorder and admitted to [facility].  Patient states that prior to this admission that he went to the ED several times and they were not able to get his medication right and that he was ultimately admitted to [facility] and then went on disability.  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  Patient states that minor things "them.  Me" and that he will go into a state where he would hyperventilate and become tearful.  He denies SI/HI/AVH.  Patient indicates that he was initially upset since he did not have his medications ordered yesterday evening which resulted in him feeling as though he was not getting the best care.  Patient states that he would like to be admitted to an inpatient facility since the [facility] does not have visitation and it is not as structured as an inpatient unit.  Discussed with patient that currently there are are no criteria for an inpatient admission; however, should he stay at the [facility] for higher level of care is indicated that recommendation can be made after period of time.  Patient verbalized understanding and was agreeable to medication adjustments-to discontinue Lamictal  and to start Seroquel 100 mg nightly.  Later in the day, patient  inquired  into other possible options as he did not see [facility] is a good fit..  Discussed the length option of PHP with patient and after consideration he felt that this would better suit his needs and requested discharge and prescription for Seroquel.  Prior to discharge collateral was obtained from wife Kiah Homeyer.  Wife shared that patient has been increasingly anxious and depressed and having panic attacks although denied concerns for [facility] safety and had no safety concerns if he were to be discharged.  Stay Summary:  Jonathon Warren, 41 y.o., male patient presents to [facility] as a walk-in accompanied by his wife on 1/11 with complaints of anxiety and panic attacks that are worsening of the last few days.On evaluation Jonathon Warren reports psychiatric history of bipolar disorder.  Reports medication management is done by his primary care doctor at this time; states he does not have any outpatient psychiatric services currently.  Patient reports he missed a couple of days of his psychotropic medications but has been back on his regular schedule for the last couple of days.  Patient reports he has been feeling very anxious, having manic thoughts, crippling fear, and panic attacks.  Reports he feels as if the incident where a needle was broken off in his arm while obtaining a flu shot and then being turned down by [employer]. is what triggered this episode.  Stating he is now terrified to go back to work  and has a panic attack when he thinks about going back.  Patient denies suicidal/self-harm/homicidal ideation, and psychosis.  He also denies paranoia, stating that the fear he has toward work he would really say it was paranoia.  Patient reports the last time he felt like this he was admitted to [facility] 7 years ago and is wanting to avoid getting any worse.  Patient is sennoside his wife and both of them seem very co dependent upon each  other.  Patient feels he needs psychiatric hospitalization for stabilization but is afraid to leave his wife stating he is needed to keep her stable.  Patient reporting only 1 psychiatric hospitalization 7 years ago.  Denies prior self harming behaviors or suicide attempt. During evaluation Jonathon Warren is sitting upright in chair in no acute distress.  He is alert/oriented x 4; calm/cooperative.  His mood is labile; 1 minute laughing joking the next that depressed and tearful.  He is speaking in a clear tone at moderate volume, and normal pace; with good eye contact.  His thought process is coherent and relevant; There is no indication that he is currently responding to internal/external stimuli or experiencing delusional thought content; and he denies suicidal/self-harm/homicidal ideation, psychosis, and paranoia.  He does endorse racing thoughts and feeling somewhat manic; also states has not slept well for the last couple of days.  Patient was continued on home medications and admitted to [facility]. See above for interview day of discharge. Patient requested discharge ultimately with script for seroquel and referral for PHP  Total Time spent with patient: 1 hour  Past Psychiatric History: bipolar diosrder Past Medical History: History reviewed. No pertinent past medical history. History reviewed. No pertinent surgical history. Family History: History reviewed. No pertinent family history. Family Psychiatric History: mother with bipolar disorder Social History:  Social History   Substance and Sexual Activity  Alcohol Use None     Social History   Substance and Sexual Activity  Drug Use Not on file    Social History   Socioeconomic History   Marital status: Married    Spouse name: Not on file   Number of children: Not on file   Years of education: Not on file   Highest education level: Not on file  Occupational History   Not on file  Tobacco Use   Smoking status: Not on file   Smokeless  tobacco: Not on file  Substance and Sexual Activity   Alcohol use: Not on file   Drug use: Not on file   Sexual activity: Not on file  Other Topics Concern   Not on file  Social History Narrative   Not on file   Social Determinants of Health   Financial Resource Strain: Not on file  Food Insecurity: Not on file  Transportation Needs: Not on file  Physical Activity: Not on file  Stress: Not on file  Social Connections: Not on file   SDOH:  SDOH Screenings   Alcohol Screen: Not on file  Depression (PHQ2-9): Medium Risk   PHQ-2 Score: 11  Financial Resource Strain: Not on file  Food Insecurity: Not on file  Housing: Not on file  Physical Activity: Not on file  Social Connections: Not on file  Stress: Not on file  Tobacco Use: Not on file  Transportation Needs: Not on file    Tobacco Cessation:  N/A, patient does not currently use tobacco products  Current Medications:  Current Facility-Administered Medications  Medication Dose Route Frequency Provider Last Rate  Last Admin   acetaminophen (TYLENOL) tablet 650 mg  650 mg Oral Q6H PRN Rankin, Shuvon B, NP   650 mg at 08/24/21 0902   albuterol (VENTOLIN HFA) 108 (90 Base) MCG/ACT inhaler 2 puff  2 puff Inhalation Q6H PRN Lindon Romp A, NP       alum & mag hydroxide-simeth (MAALOX/MYLANTA) 200-200-20 MG/5ML suspension 30 mL  30 mL Oral Q4H PRN Rankin, Shuvon B, NP       amLODipine (NORVASC) tablet 5 mg  5 mg Oral Daily Lindon Romp A, NP   5 mg at 08/24/21 0858   busPIRone (BUSPAR) tablet 10 mg  10 mg Oral BID PRN Rozetta Nunnery, NP       clonazePAM (KLONOPIN) tablet 1 mg  1 mg Oral TID PRN Ival Bible, MD   1 mg at 08/24/21 1445   losartan (COZAAR) tablet 100 mg  100 mg Oral Daily Ival Bible, MD   100 mg at 08/24/21 [id number]   And   hydrochlorothiazide (HYDRODIURIL) tablet 25 mg  25 mg Oral Daily Ival Bible, MD   25 mg at 08/24/21 [id number]   hydrOXYzine (ATARAX) tablet 25 mg  25 mg Oral TID PRN Rankin,  Shuvon B, NP   25 mg at 08/24/21 0859   lamoTRIgine (LAMICTAL) tablet 25 mg  25 mg Oral QHS Ival Bible, MD       magnesium hydroxide (MILK OF MAGNESIA) suspension 30 mL  30 mL Oral Daily PRN Rankin, Shuvon B, NP       metFORMIN (GLUCOPHAGE) tablet 1,000 mg  1,000 mg Oral QHS Lindon Romp A, NP   1,000 mg at 08/23/21 2200   metoprolol tartrate (LOPRESSOR) tablet 50 mg  50 mg Oral BID Lindon Romp A, NP   50 mg at 08/24/21 0858   PARoxetine (PAXIL) tablet 20 mg  20 mg Oral BID Lindon Romp A, NP   20 mg at 08/24/21 0858   QUEtiapine (SEROQUEL) tablet 100 mg  100 mg Oral QHS Ival Bible, MD       traZODone (DESYREL) tablet 200 mg  200 mg Oral QHS Lindon Romp A, NP   200 mg at 08/23/21 2200   Current Outpatient Medications  Medication Sig Dispense Refill   albuterol (VENTOLIN HFA) 108 (90 Base) MCG/ACT inhaler Inhale 2 puffs into the lungs every 6 (six) hours as needed for wheezing or shortness of breath.     amLODipine (NORVASC) 5 MG tablet Take 5 mg by mouth daily.     busPIRone (BUSPAR) 10 MG tablet Take 10 mg by mouth 2 (two) times daily as needed (For anxiety).     clonazePAM (KLONOPIN) 1 MG tablet Take 1 mg by mouth in the morning and at bedtime.     ibuprofen (ADVIL) 200 MG tablet Take 800 mg by mouth in the morning and at bedtime.     lamoTRIgine 50 MG TBDP Take 50 mg by mouth at bedtime.     losartan-hydrochlorothiazide (HYZAAR) 100-25 MG tablet Take 1 tablet by mouth daily.     melatonin 5 MG TABS Take 5-10 mg by mouth at bedtime.     metFORMIN (GLUCOPHAGE) 1000 MG tablet Take 1,000 mg by mouth at bedtime.     metoprolol tartrate (LOPRESSOR) 50 MG tablet Take 50 mg by mouth in the morning and at bedtime.     Multiple Vitamins-Minerals (AIRBORNE GUMMIES PO) Take 2 tablets by mouth daily as needed (For immune health).     PARoxetine (PAXIL)  20 MG tablet Take 20 mg by mouth in the morning and at bedtime.     traZODone (DESYREL) 100 MG tablet Take 200 mg by mouth at  bedtime.     QUEtiapine (SEROQUEL) 100 MG tablet Take 1 tablet (100 mg total) by mouth at bedtime. 30 tablet 0    PTA Medications: (Not in a hospital admission)   Musculoskeletal  Strength & Muscle Tone: within normal limits Gait & Station: normal Patient leans: N/A  Psychiatric Specialty Exam  Presentation  General Appearance: Appropriate for Environment; Casual  Eye Contact:Good  Speech:Clear and Coherent; Normal Rate  Speech Volume:Normal  Handedness:Right   Mood and Affect  Mood:Dysphoric; Depressed; Anxious  Affect:Appropriate; Congruent; Constricted   Thought Process  Thought Processes:Coherent; Goal Directed; Linear  Descriptions of Associations:Intact  Orientation:Full (Time, Place and Person)  Thought Content:Logical; WDL  Diagnosis of Schizophrenia or Schizoaffective disorder in past: No    Hallucinations:Hallucinations: None  Ideas of Reference:None  Suicidal Thoughts:Suicidal Thoughts: No  Homicidal Thoughts:Homicidal Thoughts: No   Sensorium  Memory:Immediate Good; Recent Good; Remote Good  Judgment:Good  Insight:Good   Executive Functions  Concentration:Good  Attention Span:Good  [place] of Knowledge:Good  Language:Good   Psychomotor Activity  Psychomotor Activity:Psychomotor Activity: Normal   Assets  Assets:Communication Skills; Desire for Improvement; Financial Resources/Insurance; Housing; Intimacy; Social Support; Resilience; Physical Health; Talents/Skills   Sleep  Sleep:Sleep: Fair   Nutritional Assessment (For OBS and [facility]BC admissions only) Has the patient had a weight loss or gain of 10 pounds or more in the last 3 months?: No Has the patient had a decrease in food intake/or appetite?: No Does the patient have dental problems?: No Does the patient have eating habits or behaviors that may be indicators of an eating disorder including binging or inducing vomiting?: No Has the patient been eating poorly  because of a decreased appetite?: 0    Physical Exam  Physical Exam Constitutional:      Appearance: Normal appearance. He is obese.  HENT:     Head: Normocephalic and atraumatic.  Eyes:     Extraocular Movements: Extraocular movements intact.  Pulmonary:     Effort: Pulmonary effort is normal.  Neurological:     General: No focal deficit present.     Mental Status: He is alert and oriented to person, place, and time.   Review of Systems  Constitutional:  Negative for chills and fever.  HENT:  Negative for hearing loss.   Eyes:  Negative for discharge and redness.  Respiratory:  Negative for cough.   Cardiovascular:  Negative for chest pain.  Gastrointestinal:  Negative for abdominal pain.  Musculoskeletal:  Negative for myalgias.  Neurological:  Negative for headaches.  Psychiatric/Behavioral:  Positive for depression. Negative for hallucinations, substance abuse and suicidal ideas. The patient is nervous/anxious and has insomnia.   Blood pressure 134/89, pulse 90, temperature 97.9 F (36.6 C), temperature source Oral, resp. rate 18, height 6\' 1"  (1.854 m), weight 131.5 kg, SpO2 100 %. Body mass index is 38.26 kg/m.  Demographic Factors:  Male and Caucasian  Loss Factors: NA  Historical Factors: Family history of mental illness or substance abuse and Impulsivity  Risk Reduction Factors:   Sense of responsibility to family, Religious beliefs about death, Employed, Living with another person, especially a relative, and Positive social support  Continued Clinical Symptoms:  Severe Anxiety and/or Agitation Panic Attacks Bipolar Disorder:   Depressive phase Previous Psychiatric Diagnoses and Treatments  Cognitive Features That Contribute To Risk:  Thought constriction (tunnel vision)    Suicide Risk:  Minimal: No identifiable suicidal ideation.  Patients presenting with no risk factors but with morbid ruminations; may be classified as minimal risk based on the  severity of the depressive symptoms  Plan Of Care/Follow-up recommendations:  Activity:  as tolerated Diet:  diabetic diet Other:    Take all medications as prescribed by his/her mental healthcare provider. Report any adverse effects and or reactions from the medicines to your outpatient provider promptly. Do not engage in alcohol and or illegal drug use while on prescription medicines. In the event of worsening symptoms, call the crisis hotline, 911 and or go to the nearest ED for appropriate evaluation and treatment of symptoms. follow-up with your primary care provider for your other medical issues, concerns and or health care needs.  Allergies as of 08/24/2021       Reactions   Lisinopril Other (See Comments)   "Makes me have to clear my throat a lot"        Medication List     TAKE these medications    AIRBORNE GUMMIES PO Take 2 tablets by mouth daily as needed (For immune health).   albuterol 108 (90 Base) MCG/ACT inhaler Commonly known as: VENTOLIN HFA Inhale 2 puffs into the lungs every 6 (six) hours as needed for wheezing or shortness of breath.   amLODipine 5 MG tablet Commonly known as: NORVASC Take 5 mg by mouth daily.   busPIRone 10 MG tablet Commonly known as: BUSPAR Take 10 mg by mouth 2 (two) times daily as needed (For anxiety).   clonazePAM 1 MG tablet Commonly known as: KLONOPIN Take 1 mg by mouth in the morning and at bedtime.   ibuprofen 200 MG tablet Commonly known as: ADVIL Take 800 mg by mouth in the morning and at bedtime.   lamoTRIgine 50 MG Tbdp Take 50 mg by mouth at bedtime.   losartan-hydrochlorothiazide 100-25 MG tablet Commonly known as: HYZAAR Take 1 tablet by mouth daily.   melatonin 5 MG Tabs Take 5-10 mg by mouth at bedtime.   metFORMIN 1000 MG tablet Commonly known as: GLUCOPHAGE Take 1,000 mg by mouth at bedtime.   metoprolol tartrate 50 MG tablet Commonly known as: LOPRESSOR Take 50 mg by mouth in the morning and at  bedtime.   PARoxetine 20 MG tablet Commonly known as: PAXIL Take 20 mg by mouth in the morning and at bedtime.   QUEtiapine 100 MG tablet Commonly known as: SEROQUEL Take 1 tablet (100 mg total) by mouth at bedtime.   traZODone 100 MG tablet Commonly known as: DESYREL Take 200 mg by mouth at bedtime.       Patient advised to stop Lamictal and to start quetiapine 100 mg nightly.  Patient advised to follow up with PHP referral.  Disposition: home, self care  Ival Bible, MD 08/24/2021, 4:41 PM

## 2021-08-24 NOTE — Progress Notes (Signed)
SPIRITUALITY GROUP NOTE  Spirituality group facilitated by Simone Curia, MDiv, [place].  Group Description: Group focused on topic of hope. Patients participated in facilitated discussion around topic, connecting with one another around experiences and definitions for hope. Group members engaged with visual explorer photos, reflecting on what hope looks like for them today. Group engaged in discussion around how their definitions of hope are present today in hospital.  Modalities: Psycho-social ed, Adlerian, Narrative, MI  Patient Progress: Purav did not attend group

## 2021-08-24 NOTE — Progress Notes (Signed)
Pt is asleep. Respirations are even and unlabored. No distress noted. Monitoring for pt's safety.

## 2021-08-24 NOTE — ED Notes (Signed)
Pt sleeping@this time. Breathing even and unlabored. Will continue to monitor for safety 

## 2021-08-24 NOTE — Clinical Social Work Psych Note (Signed)
LCSW Initial/Discharge Note  LCSW and Dr. Serafina Mitchell, MD met with patient for introduction and to begin discussions regarding treatment and possible discharge planning.   Prior to engaging with Arnaldo Natal and MD were notified that Stran requested to be transferred to another facility due to him believing his needs were not being met on the [facility].   Upon approach, Ozie presented with euthymic affect, depressed mood. Trevell shared that he came to the [facility] with his wife, [name] Borunda [phone]) seeking assistance for extreme anxiety and panic attacks for the past few days. Addison shared that he believes he is experiencing an increase in anxiety symptoms due to concerns with feeling overwhelmed and "terrified" at the idea of going back to work.  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   Nehemias shared that he is not sure why he became anxious about returning to work. He reports his last episode of increased anxiety and panic attacks was 7 years ago, when he received inpatient services at [facility] in [place], [place].   Dr. Serafina Mitchell, MD spoke with Corene Cornea about potential medication changes. Lorry was agreeable with trying an alternative medication, however he continued to express that he does not believe his needs were being met on the [facility]. Hobie shared that he was expecting more "programming and therapy services" versus  crisis stabilization services. LCSW and MD spoke with Corene Cornea throughout the day about the differences between the different levels of care and which level of care we were recommending at this time.   LCSW and MD shared with Aime that at this time, he did not meet criteria for inpatient psychiatric treatment services. LCSW and MD explained that if he did not believe his needs were being met in this particular setting,  that he had the option to request discharge with appropriate follow up. Tajai expressed understanding and was able to agree to follow up with an intensive level of outpatient services.    LCSW referred patient to [facility] with Randie Heinz, due to the patient having private insurance.   Colden reports he plans to discharge home with his wife and other family members. He reports his wife will pick him up at discharge.    CSW will continue to follow until discharge.    Radonna Ricker, MSW, LCSW Clinical Education officer, museum ([place]) [facility]

## 2021-08-24 NOTE — ED Notes (Signed)
Pt eating breakfast 

## 2021-08-30 ENCOUNTER — Other Ambulatory Visit (HOSPITAL_COMMUNITY): Payer: PRIVATE HEALTH INSURANCE | Attending: Psychiatry | Admitting: Professional

## 2021-08-30 ENCOUNTER — Other Ambulatory Visit: Payer: Self-pay

## 2021-08-30 DIAGNOSIS — F411 Generalized anxiety disorder: Secondary | ICD-10-CM | POA: Insufficient documentation

## 2021-08-30 DIAGNOSIS — Z79899 Other long term (current) drug therapy: Secondary | ICD-10-CM | POA: Insufficient documentation

## 2021-08-30 DIAGNOSIS — F3132 Bipolar disorder, current episode depressed, moderate: Secondary | ICD-10-CM | POA: Insufficient documentation

## 2021-08-31 ENCOUNTER — Other Ambulatory Visit (HOSPITAL_COMMUNITY): Payer: PRIVATE HEALTH INSURANCE | Admitting: Licensed Clinical Social Worker

## 2021-08-31 ENCOUNTER — Other Ambulatory Visit (HOSPITAL_COMMUNITY): Payer: PRIVATE HEALTH INSURANCE

## 2021-08-31 ENCOUNTER — Other Ambulatory Visit: Payer: Self-pay

## 2021-08-31 DIAGNOSIS — F411 Generalized anxiety disorder: Secondary | ICD-10-CM | POA: Diagnosis not present

## 2021-08-31 DIAGNOSIS — Z79899 Other long term (current) drug therapy: Secondary | ICD-10-CM | POA: Diagnosis not present

## 2021-08-31 DIAGNOSIS — F3132 Bipolar disorder, current episode depressed, moderate: Secondary | ICD-10-CM | POA: Diagnosis present

## 2021-08-31 NOTE — Psych (Signed)
Virtual Visit via Video Note  I connected with Jonathon Warren on 08/30/21 at  3:00 PM EST by a video enabled telemedicine application and verified that I am speaking with the correct person using two identifiers.  Location: Patient: Home Provider: Clinical Home office   I discussed the limitations of evaluation and management by telemedicine and the availability of in person appointments. The patient expressed understanding and agreed to proceed.  Follow Up Instructions:    I discussed the assessment and treatment plan with the patient. The patient was provided an opportunity to ask questions and all were answered. The patient agreed with the plan and demonstrated an understanding of the instructions.   The patient was advised to call back or seek an in-person evaluation if the symptoms worsen or if the condition fails to improve as anticipated.  I provided 60 minutes of non-face-to-face time during this encounter.   Jonathon Warren, [facility]    Comprehensive Clinical Assessment (CCA) Note  08/30/2021 Jonathon Warren [id number]  Chief Complaint:  Chief Complaint  Patient presents with   Depression   Anxiety   Follow-up   Visit Diagnosis: BP    CCA Screening, Triage and Referral (STR)  Patient Reported Information How did you hear about [name]? Hospital Discharge  Referral name: [facility]  Referral phone number: No data recorded  Whom do you see for routine medical problems? Primary Care  Practice/Facility [facility]: [name]  Practice/Facility Phone Number: No data recorded Name of Contact: No data recorded Contact Number: No data recorded Contact Fax Number: No data recorded Prescriber Name: No data recorded Prescriber Address (if known): No data recorded  What Is the Reason for Your Visit/Call Today? anxiety; bipolar; depression  How Long Has This Been Causing You Problems? > than 6 months  What Do You Feel Would Help You the Most Today? Treatment for Depression or  other mood problem   Have You Recently Been in Any Inpatient Treatment (Hospital/Detox/Crisis Center/28-Day Program)? Yes  Name/Location of Program/Hospital:[facility]  How Long Were You There? 1 day  When Were You Discharged? 08/24/21   Have You Ever Received Services From [employer] Before? Yes  Who Do You See at [facility]? No data recorded  Have You Recently Had Any Thoughts About Hurting Yourself? No  Are You Planning to Commit Suicide/Harm Yourself At This time? No   Have you Recently Had Thoughts About [place]? No  Explanation: No data recorded  Have You Used Any Alcohol or Drugs in the Past 24 Hours? No  How Long Ago Did You Use Drugs or Alcohol? No data recorded What Did You Use and How Much? No data recorded  Do You Currently Have a Therapist/Psychiatrist? No  Name of Therapist/Psychiatrist: No data recorded  Have You Been Recently Discharged From Any Office Practice or Programs? No  Explanation of Discharge From Practice/Program: No data recorded    CCA Screening Triage Referral Assessment Type of Contact: Tele-Assessment  Is this Initial or Reassessment? Initial Assessment  Date Telepsych consult ordered in [facility]:  No data recorded Time Telepsych consult ordered in [facility]:  No data recorded  Patient Reported Information Reviewed? No data recorded Patient Left Without Being Seen? No data recorded Reason for Not Completing Assessment: No data recorded  Collateral Involvement: notes   Does Patient Have a Felt? No data recorded Name and Contact of Legal Guardian: No data recorded If Minor and Not Living with Parent(s), Who has Custody? No data recorded Is CPS  involved or ever been involved? Never  Is APS involved or ever been involved? Never   Patient Determined To Be At Risk for Harm To Self or Others Based on Review of Patient Reported Information or Presenting Complaint? No  Method: No data  recorded Availability of Means: No data recorded Intent: No data recorded Notification Required: No data recorded Additional Information for Danger to Others Potential: No data recorded Additional Comments for Danger to Others Potential: No data recorded Are There Guns or Other Weapons in Your Home? No data recorded Types of Guns/Weapons: No data recorded Are These Weapons Safely Secured?                            No data recorded Who Could Verify You Are Able To Have These Secured: No data recorded Do You Have any Outstanding Charges, Pending Court Dates, Parole/Probation? No data recorded Contacted To Inform of Risk of Harm To Self or Others: No data recorded  Location of Assessment: Other (comment)   Does Patient Present under Involuntary Commitment? No  IVC Papers Initial File Date: No data recorded  [place] of Residence: [place]   Patient Currently Receiving the Following Services: Medication Management   Determination of Need: Urgent (48 hours)   Options For Referral: Partial Hospitalization     CCA Biopsychosocial Intake/Chief Complaint:  Pt reports per [facility]. Pts wife is present for CCA at patient request. Pt reports he woke up last Wednesday and had a major panic attack, feelings of guilt, feelings of worthlessness. Pt reports agitation and anxiety around others. Pt reports paranoia, including thinking wife is cheating, though pt reports he knows these things are not true. 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 Pt reports he had to go home early on Monday 1/9 and called out Tuesday 1/10. Pt is concerned about how job will react. Pt is concerned 12hr shifts are too much for him and would like 8hr shifts. 2) Family: My wife and youngest dont get along and that stresses me out. 3) Flu Shot: Pt reports he has a piece of metal stuck in his arm due to a flu shot and workers comp denied him. 4) Back pain: Pt reports he caught a  resident when falling a few years ago and my back slipped out.  Pt reports the last time he had increased panic attacks and felt this way was 7-8years ago when he was hospitalized at [facility]. Pt reports I dont want to end up in the hospital again. Pt denies any other MH treatment. Pt denies suicide attempts or self-harm acts. Pt has an attack during the assessment; wife able to help calm him down by telling him youre safe, youre loved. Pt denies current SI/HI/AVH.  Current Symptoms/Problems: Increased irritability; sleep issues- pt reports trouble falling asleep without Trazadone; anhedonia; I shut down.; increased isolation; hard to get out of bed; decreased motivation; racing thoughts; rabbit holing- cognitive distortions; feelings of being overwhelmed; feelings of hopelessness/worthlessness; decreased concentration; increased anxiety; hyperventalating: increased tearfulness; panic attacks- every day, multiple times, 84min-1hr- pt unaware of triggers; decreased ADLs- household, somewhat decreased with showering   Patient Reported Schizophrenia/Schizoaffective Diagnosis in Past: No   Strengths: has support; motivation for treatment  Preferences: to get better  Abilities: can attend and participate in treatment   Type of Services Patient Feels are Needed: PHP   Initial Clinical Notes/Concerns: No data recorded  Mental Health  Symptoms Depression:   Difficulty Concentrating; Fatigue; Hopelessness; Worthlessness; Increase/decrease in appetite; Sleep (too much or little); Irritability; Change in energy/activity; Tearfulness   Duration of Depressive symptoms:  Greater than two weeks   Mania:   Irritability; Racing thoughts   Anxiety:    Restlessness; Sleep; Tension; Worrying; Fatigue; Difficulty concentrating; Irritability   Psychosis:   None   Duration of Psychotic symptoms: No data recorded  Trauma:   None   Obsessions:   None   Compulsions:   None    Inattention:   N/A   Hyperactivity/Impulsivity:   N/A   Oppositional/Defiant Behaviors:   N/A   Emotional Irregularity:   Mood lability; Chronic feelings of emptiness; Unstable self-image; Intense/unstable relationships   Other Mood/Personality Symptoms:  No data recorded   Mental Status Exam Appearance and self-care  Stature:   Average   Weight:   Overweight   Clothing:   Casual   Grooming:   Normal   Cosmetic use:   None   Posture/gait:   Normal   Motor activity:   Restless   Sensorium  Attention:   Distractible; Normal   Concentration:   Anxiety interferes; Variable; Focuses on irrelevancies   Orientation:   X5   Recall/memory:   Normal   Affect and Mood  Affect:   Depressed   Mood:   Depressed; Anxious   Relating  Eye contact:   Fleeting   Facial expression:   Depressed   Attitude toward examiner:   Cooperative   Thought and Language  Speech flow:  Normal   Thought content:   Appropriate to Mood and Circumstances   Preoccupation:   None   Hallucinations:   None   Organization:  No data recorded  [employer] of Knowledge:   Average   Intelligence:   Average   Abstraction:   Normal   Judgement:   Fair   Reality Testing:   Adequate   Insight:   Gaps   Decision Making:   Impulsive; Vacilates; Normal; Paralyzed   Social Functioning  Social Maturity:   Responsible; Isolates   Social Judgement:   Normal   Stress  Stressors:   Work; Family conflict; Illness; Grief/losses   Coping Ability:   Exhausted; Overwhelmed   Skill Deficits:   Interpersonal; Responsibility; Self-control; Communication   Supports:   Family; Friends/Service system; Church     Religion: Religion/Spirituality Are You A Religious Person?: Yes 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: Leisure / Recreation Do You Have Hobbies?: No  Exercise/Diet: Exercise/Diet Do You Exercise?:  No Have You Gained or Lost A Significant Amount of Weight in the Past Six Months?: No Do You Follow a Special Diet?: No Do You Have Any Trouble Sleeping?: Yes Explanation of Sleeping Difficulties: Unable to estimate, however states he isn't sleeping well even with trazadone   CCA Employment/Education Employment/Work Situation: Employment / Work Situation Employment Situation: Employed Where is Patient Currently Employed?: [employer] has Patient Been Employed?: 3 months Are You Satisfied With Your Job?: No Do You Work More Than One Job?: No Work Stressors: 12 hour shifts Patient's Job has Been Impacted by Current Illness: Yes Describe how Patient's Job has Been Impacted: Pt had to leave work early and call out Has Patient ever Been in Passenger transport manager?: No  Education: Education Last Grade Completed: 16 Did [place]?: Yes What Type of College Degree Do you Have?: Patient has a nursing degree Did [place]?: No Did  You Have An Individualized Education Program (IIEP): No Did You Have Any Difficulty At School?: No Patient's Education Has Been Impacted by Current Illness: No   CCA Family/Childhood History Family and Relationship History: Family history Marital status: Married Number of Years Married: 2 What types of issues is patient dealing with in the relationship?: No concerns noted Additional relationship information: NA Are you sexually active?: Yes What is your sexual orientation?: heterosexual Does patient have children?: Yes How many children?: 2 (NA - has kids and step-children) How is patient's relationship with their children?: 2 boys; 43yo; 21yo-lives with  Childhood History:  Childhood History By whom was/is the patient raised?: Mother, Father Additional childhood history information: Mother has bipolar- she was "in and out of the hospital"; "Dad did his best. He worked a ton of hours to support our family of 6."  Parents divorced when pt was about 1 Description of patient's relationship with caregiver when they were a child: good with Dad; distant with Mom Patient's description of current relationship with people who raised him/her: Mom: no relationship "she's toxic."; Dad: loves but somewhat distant Does patient have siblings?: Yes Number of Siblings: 3 Did patient suffer any verbal/emotional/physical/sexual abuse as a child?: No Did patient suffer from severe childhood neglect?: No Has patient ever been sexually abused/assaulted/raped as an adolescent or adult?: No Was the patient ever a victim of a crime or a disaster?: No Witnessed domestic violence?: No Has patient been affected by domestic violence as an adult?: No  Child/Adolescent Assessment:     CCA Substance Use Alcohol/Drug Use: Alcohol / Drug Use Pain Medications: See MAR Prescriptions: See MAR Over the Counter: See MAR History of alcohol / drug use?: No history of alcohol / drug abuse                         ASAM's:  Six Dimensions of Multidimensional Assessment  Dimension 1:  Acute Intoxication and/or Withdrawal Potential:      Dimension 2:  Biomedical Conditions and Complications:      Dimension 3:  Emotional, Behavioral, or Cognitive Conditions and Complications:     Dimension 4:  Readiness to Change:     Dimension 5:  Relapse, Continued use, or Continued Problem Potential:     Dimension 6:  Recovery/Living Environment:     ASAM Severity Score:    ASAM Recommended Level of Treatment:     Substance use Disorder (SUD)    Recommendations for Services/Supports/Treatments: Recommendations for Services/Supports/Treatments Recommendations For Services/Supports/Treatments: Partial Hospitalization  DSM5 Diagnoses: Patient Active Problem List   Diagnosis Date Noted   Bipolar 1 disorder, depressed, moderate ([place]) 08/30/2021   Generalized anxiety disorder 08/30/2021   Bipolar I disorder, most recent episode  mixed ([place]) 08/23/2021   Bipolar affective disorder, current episode mixed, without psychotic features ([place]) 08/23/2021    Patient Centered Plan: Patient is on the following Treatment Plan(s):  Anxiety   Referrals to Alternative Service(s): Referred to Alternative Service(s):   Place:   Date:   Time:    Referred to Alternative Service(s):   Place:   Date:   Time:    Referred to Alternative Service(s):   Place:   Date:   Time:    Referred to Alternative Service(s):   Place:   Date:   Time:     Jonathon Warren, [facility]

## 2021-09-01 ENCOUNTER — Other Ambulatory Visit (HOSPITAL_COMMUNITY): Payer: PRIVATE HEALTH INSURANCE

## 2021-09-01 ENCOUNTER — Other Ambulatory Visit: Payer: Self-pay

## 2021-09-01 ENCOUNTER — Other Ambulatory Visit (HOSPITAL_COMMUNITY): Payer: PRIVATE HEALTH INSURANCE | Admitting: Licensed Clinical Social Worker

## 2021-09-01 DIAGNOSIS — F411 Generalized anxiety disorder: Secondary | ICD-10-CM

## 2021-09-01 DIAGNOSIS — F3132 Bipolar disorder, current episode depressed, moderate: Secondary | ICD-10-CM | POA: Diagnosis not present

## 2021-09-01 MED ORDER — HYDROXYZINE PAMOATE 25 MG PO CAPS: 25.0000 mg | ORAL_CAPSULE | Freq: Three times a day (TID) | ORAL | 0 refills | Status: DC | PRN

## 2021-09-01 MED ORDER — RISPERIDONE 1 MG PO TABS: 1.0000 mg | ORAL_TABLET | Freq: Every day | ORAL | 0 refills | Status: DC

## 2021-09-01 NOTE — Progress Notes (Addendum)
Virtual Visit via Video Note  I connected with Jonathon Warren on 09/01/21 at  9:00 AM EST by a video enabled telemedicine application and verified that I am speaking with the correct person using two identifiers.  Location: Patient: Home   Provider: Office   I discussed the limitations of evaluation and management by telemedicine and the availability of in person appointments. The patient expressed understanding and agreed to proceed.     I discussed the assessment and treatment plan with the patient. The patient was provided an opportunity to ask questions and all were answered. The patient agreed with the plan and demonstrated an understanding of the instructions.   The patient was advised to call back or seek an in-person evaluation if the symptoms worsen or if the condition fails to improve as anticipated.  I provided 15 minutes of non-face-to-face time during this encounter.   Jonathon Rack, NP   Behavioral Health Partial Program Assessment Note  Date: 09/01/2021 Name: Jonathon Warren MRN: [id number]  Chief Complaint:   Subjective:   Jonathon Warren is a 41 y.o. Caucasian male presents with feelings of worthlessness and stress. He reported a history of depression and anxiety states he has been on multiple psychotropic medications in the past which she does not feel is helping his mood.  States he recently was seen and evaluated at the local urgent care due to reported withdrawal symptoms.  States a previous inpatient admission.  He reports he was initiated on Seroquel however states he does not like the way this medication makes him feel.  Discussed initiating 1 mg risperidone patient was receptive to plan patient was enrolled in partial psychiatric program on 09/01/21. Jonathon Warren.   Primary complaints include: concern about health problems, depression worse, difficulty sleeping, feeling depressed, and feeling suicidal.  Onset of symptoms was gradual with gradually  worsening course since that time. Psychosocial Stressors include the following: family and marital.  Denied that his follow-up therapy and/or psychiatry currently.  Reports concerns with medications being represcribed.  States he is prescribed Klonopin 1 mg daily and 2 mg nightly for insomnia and racing thoughts.  I have reviewed the following documentation dated 09/01/2021: past psychiatric history and past medical history  Complaints of Pain: nonear Past Psychiatric History:  None  Currently in treatment with Seroquel, BuSpar, hydroxyzine, Paxil and Klonopin  Substance Abuse History: none Use of Alcohol: denied Use of Caffeine: denies use Use of over the counter:   No past surgical history on file.  No past medical history on file. Outpatient Encounter Medications as of 09/01/2021  Medication Sig Note   albuterol (VENTOLIN HFA) 108 (90 Base) MCG/ACT inhaler Inhale 2 puffs into the lungs every 6 (six) hours as needed for wheezing or shortness of breath.    amLODipine (NORVASC) 5 MG tablet Take 5 mg by mouth daily.    busPIRone (BUSPAR) 10 MG tablet Take 10 mg by mouth 2 (two) times daily as needed (For anxiety).    clonazePAM (KLONOPIN) 1 MG tablet Take 1 mg by mouth in the morning and at bedtime. 08/23/2021: This is prescribed for him to take one tablet in the morning and two tablets at night. However, he stated that he usually only takes one tablet in the morning and at night. He will keep the additional tablet in his pocket and take it as needed.   ibuprofen (ADVIL) 200 MG tablet Take 800 mg by mouth in the morning and at bedtime.    lamoTRIgine 50 MG  TBDP Take 50 mg by mouth at bedtime.    losartan-hydrochlorothiazide (HYZAAR) 100-25 MG tablet Take 1 tablet by mouth daily.    melatonin 5 MG TABS Take 5-10 mg by mouth at bedtime.    metFORMIN (GLUCOPHAGE) 1000 MG tablet Take 1,000 mg by mouth at bedtime.    metoprolol tartrate (LOPRESSOR) 50 MG tablet Take 50 mg by mouth in the morning  and at bedtime.    Multiple Vitamins-Minerals (AIRBORNE GUMMIES PO) Take 2 tablets by mouth daily as needed (For immune health).    PARoxetine (PAXIL) 20 MG tablet Take 20 mg by mouth in the morning and at bedtime.    QUEtiapine (SEROQUEL) 100 MG tablet Take 1 tablet (100 mg total) by mouth at bedtime.    traZODone (DESYREL) 100 MG tablet Take 200 mg by mouth at bedtime.    No facility-administered encounter medications on file as of 09/01/2021.   Allergies  Allergen Reactions   Lisinopril Other (See Comments)    "Makes me have to clear my throat a lot"    Social History   Tobacco Use   Smoking status: Not on file   Smokeless tobacco: Not on file  Substance Use Topics   Alcohol use: Not on file   Functioning Relationships: good support system and good relationship with spouse or significant other Education: College       Please specify degree: \RN Other Pertinent History: None No family history on file.   Review of Systems Constitutional: negative  Objective:  There were no vitals filed for this visit.  Physical Exam:   Mental Status Exam: Appearance:  Well groomed Psychomotor::  Within Normal Limits Attention span and concentration: Normal Behavior: calm, cooperative, and adequate rapport can be established Speech:  normal pitch and normal volume Mood:  anxious Affect:  normal Thought Process:  Coherent Thought Content:  Logical Orientation:  person and place Cognition:  grossly intact Insight:  Intact Judgment:  Fair Estimate of Intelligence: Average Fund of knowledge: Aware of current events Memory: Recent and remote intact Abnormal movements: None Gait and station: Normal  Assessment:  Diagnosis: Bipolar 1 disorder, depressed, moderate (HCC) [F31.32] 1. Bipolar 1 disorder, depressed, moderate (HCC)   2. Generalized anxiety disorder     Indications for admission: inpatient care required if not in partial hospital program  Plan: Orders placed for  occupational therapy (OT)  patient enrolled in Partial Hospitalization Program, patient's current medications are to be continued, the following medications are being prescribed Risperdal 1 mg nightly  and Hydroxyzine 25 mg po TID PRN , patient to discontinue Seroquel a comprehensive treatment plan will be developed, side effects of medications have been reviewed with patient, and patient to discontinue Seroquel 100 mg nightly.  - patient is requesting a refill with Klonopin 1 mg per PMDP- was refilled on 08/19/2021   Treatment options and alternatives reviewed with patient and patient understands the above plan.   Comments:     Jonathon Rack, NP

## 2021-09-04 ENCOUNTER — Other Ambulatory Visit: Payer: Self-pay

## 2021-09-04 ENCOUNTER — Encounter (HOSPITAL_COMMUNITY): Payer: Self-pay | Admitting: Family

## 2021-09-04 ENCOUNTER — Other Ambulatory Visit (HOSPITAL_COMMUNITY): Payer: PRIVATE HEALTH INSURANCE | Admitting: Licensed Clinical Social Worker

## 2021-09-04 ENCOUNTER — Other Ambulatory Visit (HOSPITAL_COMMUNITY): Payer: PRIVATE HEALTH INSURANCE

## 2021-09-04 DIAGNOSIS — F3132 Bipolar disorder, current episode depressed, moderate: Secondary | ICD-10-CM

## 2021-09-04 DIAGNOSIS — F411 Generalized anxiety disorder: Secondary | ICD-10-CM

## 2021-09-05 ENCOUNTER — Other Ambulatory Visit (HOSPITAL_COMMUNITY): Payer: PRIVATE HEALTH INSURANCE

## 2021-09-05 ENCOUNTER — Telehealth (HOSPITAL_COMMUNITY): Payer: Self-pay

## 2021-09-05 ENCOUNTER — Other Ambulatory Visit: Payer: Self-pay

## 2021-09-05 ENCOUNTER — Other Ambulatory Visit (HOSPITAL_COMMUNITY): Payer: PRIVATE HEALTH INSURANCE | Admitting: Licensed Clinical Social Worker

## 2021-09-05 DIAGNOSIS — F411 Generalized anxiety disorder: Secondary | ICD-10-CM

## 2021-09-05 DIAGNOSIS — F3132 Bipolar disorder, current episode depressed, moderate: Secondary | ICD-10-CM

## 2021-09-05 NOTE — BH Assessment (Signed)
Care Management - [facility] Follow Up Discharges   Writer attempted to make contact with patient today and was unsuccessful.  Writer left a HIPPA compliant voice message.   Per chart review, patient will follow up with the [facility].  Patient completed an intake appointment for the [facility] (Partial Hospitalization Program) on 09-01-2021 with [name], NP.

## 2021-09-06 ENCOUNTER — Other Ambulatory Visit (HOSPITAL_COMMUNITY): Payer: PRIVATE HEALTH INSURANCE

## 2021-09-06 ENCOUNTER — Other Ambulatory Visit (HOSPITAL_COMMUNITY): Payer: PRIVATE HEALTH INSURANCE | Admitting: Licensed Clinical Social Worker

## 2021-09-06 DIAGNOSIS — F3132 Bipolar disorder, current episode depressed, moderate: Secondary | ICD-10-CM

## 2021-09-06 DIAGNOSIS — F411 Generalized anxiety disorder: Secondary | ICD-10-CM

## 2021-09-07 ENCOUNTER — Other Ambulatory Visit (HOSPITAL_COMMUNITY): Payer: PRIVATE HEALTH INSURANCE | Admitting: Licensed Clinical Social Worker

## 2021-09-07 ENCOUNTER — Other Ambulatory Visit (HOSPITAL_COMMUNITY): Payer: PRIVATE HEALTH INSURANCE

## 2021-09-07 ENCOUNTER — Other Ambulatory Visit: Payer: Self-pay

## 2021-09-07 DIAGNOSIS — F3132 Bipolar disorder, current episode depressed, moderate: Secondary | ICD-10-CM

## 2021-09-07 DIAGNOSIS — F411 Generalized anxiety disorder: Secondary | ICD-10-CM

## 2021-09-08 ENCOUNTER — Other Ambulatory Visit (HOSPITAL_COMMUNITY): Payer: PRIVATE HEALTH INSURANCE

## 2021-09-08 ENCOUNTER — Other Ambulatory Visit (HOSPITAL_COMMUNITY): Payer: PRIVATE HEALTH INSURANCE | Admitting: Licensed Clinical Social Worker

## 2021-09-08 DIAGNOSIS — F411 Generalized anxiety disorder: Secondary | ICD-10-CM

## 2021-09-08 DIAGNOSIS — F3132 Bipolar disorder, current episode depressed, moderate: Secondary | ICD-10-CM | POA: Diagnosis not present

## 2021-09-08 NOTE — Progress Notes (Signed)
Late entry: Charting for 09/07/22 at 10:00am. Spoke with patient via Webex video call, used 2 identifiers to correctly identify patient. States this is his first time in [facility] as referred by [employer]. He had an event where he hyperventilated, was crying and unable to go to work. Has been trying to get workers comp for a mandatory flu shot he was required to get. A foreign body was left in his arm that causes pain and discomfort but he is unable to get help to find out exactly what was left in his arm. He believes a tip of the needle broke when the nurse administered the injection by hitting his bone. He has had a couple panic attacks in recent years and was on disability for 2 years in the past. 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 Feels like he lives in a prison and has to make himself do things. Does not shower daily and worries about going back to work, has a lot of social anxiety. Thinking about going back on disability and asking to start the process in case it comes to that. On scale 1-10 as 10 being worst he rates depression at 6 and anxiety at 5. Denies SI/HI or AV hallucinations. PHQ9=22. No issues or complaints. Groups are going well so far. No side effects from medications.

## 2021-09-11 ENCOUNTER — Other Ambulatory Visit: Payer: Self-pay

## 2021-09-11 ENCOUNTER — Other Ambulatory Visit (HOSPITAL_COMMUNITY): Payer: PRIVATE HEALTH INSURANCE | Admitting: Licensed Clinical Social Worker

## 2021-09-11 ENCOUNTER — Other Ambulatory Visit (HOSPITAL_COMMUNITY): Payer: PRIVATE HEALTH INSURANCE

## 2021-09-11 DIAGNOSIS — F3132 Bipolar disorder, current episode depressed, moderate: Secondary | ICD-10-CM | POA: Diagnosis not present

## 2021-09-11 DIAGNOSIS — F411 Generalized anxiety disorder: Secondary | ICD-10-CM

## 2021-09-12 ENCOUNTER — Other Ambulatory Visit: Payer: Self-pay

## 2021-09-12 ENCOUNTER — Other Ambulatory Visit (HOSPITAL_COMMUNITY): Payer: PRIVATE HEALTH INSURANCE | Admitting: Licensed Clinical Social Worker

## 2021-09-12 ENCOUNTER — Other Ambulatory Visit (HOSPITAL_COMMUNITY): Payer: PRIVATE HEALTH INSURANCE

## 2021-09-12 ENCOUNTER — Encounter (HOSPITAL_COMMUNITY): Payer: Self-pay | Admitting: Family

## 2021-09-12 DIAGNOSIS — F3132 Bipolar disorder, current episode depressed, moderate: Secondary | ICD-10-CM

## 2021-09-12 DIAGNOSIS — F411 Generalized anxiety disorder: Secondary | ICD-10-CM

## 2021-09-12 NOTE — Progress Notes (Signed)
Virtual Visit via Video Note  I connected with Jonathon Warren on 09/08/21 at  9:00 AM EST by a video enabled telemedicine application and verified that I am speaking with the correct person using two identifiers.  Location: Patient: Office Provider: Home   I discussed the limitations of evaluation and management by telemedicine and the availability of in person appointments. The patient expressed understanding and agreed to proceed.    I discussed the assessment and treatment plan with the patient. The patient was provided an opportunity to ask questions and all were answered. The patient agreed with the plan and demonstrated an understanding of the instructions.   The patient was advised to call back or seek an in-person evaluation if the symptoms worsen or if the condition fails to improve as anticipated.  I provided 15 minutes of non-face-to-face time during this encounter.   Jonathon Rack, NP   [facility] MD/PA/NP OP Progress Note  09/12/2021 11:56 AM Jonathon Warren  MRN:  [id number]  Chief Complaint:  "  I am feeing a lot better since I stopped taking Seroquel."  HPI:  Jonathon Warren was seen and evaluated via WebEx.  He is denying suicidal or homicidal ideations.  Denies auditory visual hallucinations.  Reports his mood has improved over the past few days since he has recently discontinued Seroquel. " I have been spending more time with my son.'  Jonathon Warren was initiated on Risperdal 1 mg p.o. nightly he reports taken and tolerating well.  He reports a good appetite.  States he is resting well throughout the night.    Reports he is only experienced a few panic attacks since changing medications.  Rates his depression 3 out of 10 with 10 being the worst.  States his anxiety is fluctuating between 3 and 4.  Nursing staff reports patient is seeking long-term/short-term disability.  Case management to follow-up with outpatient provider for follow-up after completion of partial hospitalization programming.  Support, encouragement and reassurances was provided.    Visit Diagnosis:    ICD-10-CM   1. Bipolar 1 disorder, depressed, moderate (HCC)  F31.32     2. Generalized anxiety disorder  F41.1       Past Psychiatric History:   Past Medical History: No past medical history on file. No past surgical history on file.  Family Psychiatric History:   Family History: No family history on file.  Social History:  Social History   Socioeconomic History   Marital status: Married    Spouse name: Not on file   Number of children: Not on file   Years of education: Not on file   Highest education level: Not on file  Occupational History   Not on file  Tobacco Use   Smoking status: Not on file   Smokeless tobacco: Not on file  Substance and Sexual Activity   Alcohol use: Not on file   Drug use: Not on file   Sexual activity: Not on file  Other Topics Concern   Not on file  Social History Narrative   Not on file   Social Determinants of Health   Financial Resource Strain: Not on file  Food Insecurity: Not on file  Transportation Needs: Not on file  Physical Activity: Not on file  Stress: Not on file  Social Connections: Not on file    Allergies:  Allergies  Allergen Reactions   Lisinopril Other (See Comments)    "Makes me have to clear my throat a lot"    Metabolic Disorder Labs: Lab  Results  Component Value Date   HGBA1C 6.7 (H) 08/23/2021   MPG 145.59 08/23/2021   No results found for: PROLACTIN Lab Results  Component Value Date   CHOL 132 08/23/2021   TRIG 248 (H) 08/23/2021   HDL 36 (L) 08/23/2021   CHOLHDL 3.7 08/23/2021   VLDL 50 (H) 08/23/2021   LDLCALC 46 08/23/2021   Lab Results  Component Value Date   TSH 1.789 08/23/2021    Therapeutic Level Labs: No results found for: LITHIUM No results found for: VALPROATE No components found for:  CBMZ  Current Medications: Current Outpatient Medications  Medication Sig Dispense Refill   amLODipine  (NORVASC) 5 MG tablet Take 5 mg by mouth daily.     busPIRone (BUSPAR) 10 MG tablet Take 10 mg by mouth 2 (two) times daily as needed (For anxiety).     clonazePAM (KLONOPIN) 1 MG tablet Take 1 mg by mouth in the morning and at bedtime.     hydrOXYzine (VISTARIL) 25 MG capsule Take 1 capsule (25 mg total) by mouth 3 (three) times daily as needed. 60 capsule 0   ibuprofen (ADVIL) 200 MG tablet Take 800 mg by mouth in the morning and at bedtime.     losartan-hydrochlorothiazide (HYZAAR) 100-25 MG tablet Take 1 tablet by mouth daily.     melatonin 5 MG TABS Take 5-10 mg by mouth at bedtime.     metFORMIN (GLUCOPHAGE) 1000 MG tablet Take 1,000 mg by mouth at bedtime.     metoprolol tartrate (LOPRESSOR) 50 MG tablet Take 50 mg by mouth in the morning and at bedtime.     Multiple Vitamins-Minerals (AIRBORNE GUMMIES PO) Take 2 tablets by mouth daily as needed (For immune health).     PARoxetine (PAXIL) 20 MG tablet Take 20 mg by mouth in the morning and at bedtime.     risperiDONE (RISPERDAL) 1 MG tablet Take 1 tablet (1 mg total) by mouth at bedtime. 15 tablet 0   traZODone (DESYREL) 100 MG tablet Take 200 mg by mouth at bedtime.     albuterol (VENTOLIN HFA) 108 (90 Base) MCG/ACT inhaler Inhale 2 puffs into the lungs every 6 (six) hours as needed for wheezing or shortness of breath. (Patient not taking: Reported on 09/08/2021)     lamoTRIgine 50 MG TBDP Take 50 mg by mouth at bedtime. (Patient not taking: Reported on 09/08/2021)     No current facility-administered medications for this visit.     Musculoskeletal:   Psychiatric Specialty Exam: Review of Systems  There were no vitals taken for this visit.There is no height or weight on file to calculate BMI.  General Appearance: Casual  Eye Contact:  Good  Speech:  Clear and Coherent  Volume:  Normal  Mood:  Anxious and Depressed  Affect:  Congruent  Thought Process:  Coherent  Orientation:  Full (Time, Place, and Person)  Thought Content:  Logical   Suicidal Thoughts:  No  Homicidal Thoughts:  No  Memory:  Immediate;   Fair Recent;   Fair  Judgement:  Good  Insight:  Good  Psychomotor Activity:  Normal  Concentration:  Concentration: Good  Recall:  Good  Fund of Knowledge: Good  Language: Fair  Akathisia:  No  Handed:  Right  AIMS (if indicated): done  Assets:  Communication Skills Resilience Social Support  ADL's:  Intact  Cognition: WNL  Sleep:  Good   Screenings: PHQ2-9    Flowsheet Row Counselor from 09/08/2021 in BEHAVIORAL HEALTH PARTIAL HOSPITALIZATION PROGRAM  Counselor from 08/30/2021 in BEHAVIORAL HEALTH PARTIAL HOSPITALIZATION PROGRAM ED from 08/23/2021 in [facility]  PHQ-2 Total Score 5 6 4   PHQ-9 Total Score 22 24 11       Flowsheet Row Counselor from 09/08/2021 in BEHAVIORAL HEALTH PARTIAL HOSPITALIZATION PROGRAM Counselor from 08/30/2021 in BEHAVIORAL HEALTH PARTIAL HOSPITALIZATION PROGRAM ED from 08/23/2021 in [facility]  C-SSRS RISK CATEGORY No Risk No Risk No Risk        Assessment and Plan:  Continue with partial hospitalization programming Continue medications as directed  Treatment plan was reviewed and agreed upon by NP 10/21/2021 and patient Jonathon Warren's need for continued group services.   Chilton Greathouse, NP 09/12/2021, 11:56 AM

## 2021-09-12 NOTE — Progress Notes (Unsigned)
Spoke with patient via Webex video call, used 2 identifiers to correctly identify patient. States groups are going OK. The dynamic of group changed with the discharge of patients and admission of new ones. He feels this group is more negative and one patient is argumentative. Asking for a refill of Risperdal before he is discharged. Message sent to NP. On scale 1-10 as 10 being worst he rates depression at 4 and anxiety at 7. Denies SI/HI or AV hallucinations. No side effects from medication.

## 2021-09-13 ENCOUNTER — Telehealth (HOSPITAL_COMMUNITY): Payer: Self-pay | Admitting: Psychiatry

## 2021-09-13 ENCOUNTER — Other Ambulatory Visit: Payer: Self-pay

## 2021-09-13 ENCOUNTER — Ambulatory Visit (HOSPITAL_COMMUNITY): Payer: No Typology Code available for payment source

## 2021-09-13 ENCOUNTER — Other Ambulatory Visit (HOSPITAL_COMMUNITY): Payer: PRIVATE HEALTH INSURANCE | Attending: Psychiatry | Admitting: Licensed Clinical Social Worker

## 2021-09-13 DIAGNOSIS — F3132 Bipolar disorder, current episode depressed, moderate: Secondary | ICD-10-CM | POA: Insufficient documentation

## 2021-09-13 DIAGNOSIS — F411 Generalized anxiety disorder: Secondary | ICD-10-CM | POA: Diagnosis not present

## 2021-09-13 MED ORDER — RISPERIDONE 1 MG PO TABS: 2.0000 mg | ORAL_TABLET | Freq: Every day | ORAL | 0 refills | Status: DC

## 2021-09-13 NOTE — Telephone Encounter (Signed)
D:  Pt will be transitioning from PHP to MH-IOP on 09-19-21.  A:  Placed call and oriented pt.  Encouraged pt to verify his benefits.  Pt will start on 09-19-21 @ 9 a.m.  R:  Pt receptive.

## 2021-09-13 NOTE — Progress Notes (Signed)
°  Virtual Visit via Video Note  I connected with Jonathon Warren on 09/13/21 at  9:00 AM EST by a video enabled telemedicine application and verified that I am speaking with the correct person using two identifiers.  Location: Patient: Office Provider: Home   I discussed the limitations of evaluation and management by telemedicine and the availability of in person appointments. The patient expressed understanding and agreed to proceed.    I discussed the assessment and treatment plan with the patient. The patient was provided an opportunity to ask questions and all were answered. The patient agreed with the plan and demonstrated an understanding of the instructions.   The patient was advised to call back or seek an in-person evaluation if the symptoms worsen or if the condition fails to improve as anticipated.  I provided 15 minutes of non-face-to-face time during this encounter.   [name], NP   [facility] Partial hospitalization outpatient Program Discharge Summary  Jonathon Warren [id number]  Admission date: 09/01/2021 Discharge date: 09/14/2021   Reason for admission: Per admission assessment note: Jonathon Warren is a 41 y.o. Caucasian male presents with feelings of worthlessness and stress. He reported a history of depression and anxiety states he has been on multiple psychotropic medications in the past which she does not feel is helping his mood.  States he recently was seen and evaluated at the local urgent care due to reported withdrawal symptoms.  States a previous inpatient admission.  He reports he was initiated on Seroquel however states he does not like the way this medication makes him feel.  Discussed initiating 1 mg risperidone patient was receptive to plan patient was enrolled in partial psychiatric program on 09/01/21.    Progress in Program Toward Treatment Goals: Onging, patient attended participated with daily group session with active and engaged  participation.  Denies suicidal or homicidal ideations.  Denies auditory visual hallucinations.  Does report some agitation which appears to be worse at night.  Discussed titrating Risperdal 1 mg to 2 mgs nightly and he was receptive to plan.  Patient has requested to discharge early from partial hospitalization programming.  And to start intensive outpatient programming on Feb 8th.   Progress (rationale): Increased Risperdal 1 mg to 2 mg nightly, patient stepping down from partial hospitalization program.  We will make refills available on the 8th of Feb   Take all medications as prescribed. Keep all follow-up appointments as scheduled.  Do not consume alcohol or use illegal drugs while on prescription medications. Report any adverse effects from your medications to your primary care provider promptly.  In the event of recurrent symptoms or worsening symptoms, call 911, a crisis hotline, or go to the nearest emergency department for evaluation.     [name], NP 09/14/2021

## 2021-09-14 ENCOUNTER — Ambulatory Visit (HOSPITAL_COMMUNITY): Payer: No Typology Code available for payment source

## 2021-09-14 ENCOUNTER — Other Ambulatory Visit (HOSPITAL_COMMUNITY): Payer: No Typology Code available for payment source

## 2021-09-15 ENCOUNTER — Other Ambulatory Visit (HOSPITAL_COMMUNITY): Payer: No Typology Code available for payment source

## 2021-09-15 ENCOUNTER — Ambulatory Visit (HOSPITAL_COMMUNITY): Payer: No Typology Code available for payment source

## 2021-09-15 ENCOUNTER — Encounter (HOSPITAL_COMMUNITY): Payer: Self-pay | Admitting: Family

## 2021-09-19 ENCOUNTER — Other Ambulatory Visit (HOSPITAL_COMMUNITY): Payer: PRIVATE HEALTH INSURANCE | Admitting: Psychiatry

## 2021-09-19 ENCOUNTER — Other Ambulatory Visit: Payer: Self-pay

## 2021-09-19 ENCOUNTER — Encounter (HOSPITAL_COMMUNITY): Payer: Self-pay | Admitting: Psychiatry

## 2021-09-19 DIAGNOSIS — F3132 Bipolar disorder, current episode depressed, moderate: Secondary | ICD-10-CM

## 2021-09-19 DIAGNOSIS — F411 Generalized anxiety disorder: Secondary | ICD-10-CM | POA: Diagnosis not present

## 2021-09-19 MED ORDER — TRAZODONE HCL 100 MG PO TABS: 200.0000 mg | ORAL_TABLET | Freq: Every day | ORAL | 1 refills | Status: DC

## 2021-09-19 MED ORDER — BUSPIRONE HCL 10 MG PO TABS: 10.0000 mg | ORAL_TABLET | Freq: Two times a day (BID) | ORAL | 0 refills | Status: AC | PRN

## 2021-09-19 MED ORDER — PAROXETINE HCL 20 MG PO TABS: 20.0000 mg | ORAL_TABLET | Freq: Two times a day (BID) | ORAL | 0 refills | Status: AC

## 2021-09-19 MED ORDER — HYDROXYZINE PAMOATE 25 MG PO CAPS: 25.0000 mg | ORAL_CAPSULE | Freq: Three times a day (TID) | ORAL | 0 refills | Status: AC | PRN

## 2021-09-19 MED ORDER — RISPERIDONE 1 MG PO TABS: 2.0000 mg | ORAL_TABLET | Freq: Every day | ORAL | 0 refills | Status: DC

## 2021-09-19 MED ORDER — LAMOTRIGINE 50 MG PO TBDP: 50.0000 mg | ORAL_TABLET | Freq: Every day | ORAL | 1 refills | Status: DC

## 2021-09-19 MED ORDER — CLONAZEPAM 1 MG PO TABS: 1.0000 mg | ORAL_TABLET | Freq: Two times a day (BID) | ORAL | 0 refills | Status: AC

## 2021-09-19 NOTE — Progress Notes (Addendum)
Virtual Visit via Video Note  I connected with Jonathon Warren on 09/19/21 at  9:00 AM EST by a video enabled telemedicine application and verified that I am speaking with the correct person using two identifiers.  Location: Patient:Home Provider: Office   I discussed the limitations of evaluation and management by telemedicine and the availability of in person appointments. The patient expressed understanding and agreed to proceed.   I discussed the assessment and treatment plan with the patient. The patient was provided an opportunity to ask questions and all were answered. The patient agreed with the plan and demonstrated an understanding of the instructions.   The patient was advised to call back or seek an in-person evaluation if the symptoms worsen or if the condition fails to improve as anticipated.  I provided 15 minutes of non-face-to-face time during this encounter.   Derrill Center, NP    Psychiatric Initial Adult Assessment   Patient Identification: Jonathon Warren MRN:  [id number] Date of Evaluation:  09/19/2021 Referral Source: PHP  Chief Complaint:   Chief Complaint   Depression; Anxiety; Stress    Visit Diagnosis:    ICD-10-CM   1. Bipolar 1 disorder, depressed, moderate ([place])  F31.32     2. Generalized anxiety disorder  F41.1       History of Present Illness:  Jonathon Warren is a 41 year old Caucasian male that recently completed partial hospitalization programming and is now starting intensive outpatient programming on 09/19/2021 due to worsening depression and anxiety.  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  Patient was recently admitted to [facility] behavioral health for mood stabilization.  He reports overall doing well.  We will refill medication.  Patient to continue with outpatient follow-up he is seeking long-term disability.  Medication adjustment was made PHP.  Patient has been taking   risperidone 2 mg nightly, which he reported is helping his mood.   Per admission assessment note: "Jonathon Warren is a 41 y.o. Caucasian male presents with feelings of worthlessness and stress. He reported a history of depression and anxiety states he has been on multiple psychotropic medications in the past which she does not feel is helping his mood.  States he recently was seen and evaluated at the local urgent care due to reported withdrawal symptoms.  States a previous inpatient admission.  He reports he was initiated on Seroquel however states he does not like the way this medication makes him feel. "  Associated Signs/Symptoms: Depression Symptoms:  depressed mood, insomnia, anxiety, (Hypo) Manic Symptoms:  Distractibility, Elevated Mood, Impulsivity, Irritable Mood, Anxiety Symptoms:  Excessive Worry, Psychotic Symptoms:  Hallucinations: None PTSD Symptoms: NA  Past Psychiatric History:   Previous Psychotropic Medications: Yes   Substance Abuse History in the last 12 months:  No.  Consequences of Substance Abuse: NA  Past Medical History:  Past Medical History:  Diagnosis Date   Anxiety    Depression    History reviewed. No pertinent surgical history.  Family Psychiatric History:   Family History: History reviewed. No pertinent family history.  Social History:   Social History   Socioeconomic History   Marital status: Married    Spouse name: Not on file   Number of children: Not on file   Years of education: Not on file   Highest education level: Not on file  Occupational History   Not on file  Tobacco Use   Smoking status: Unknown   Smokeless tobacco: Not on  file  Vaping Use   Vaping Use: Unknown  Substance and Sexual Activity   Alcohol use: Never   Drug use: Never   Sexual activity: Not on file  Other Topics Concern   Not on file  Social History Narrative   Not on file   Social Determinants of Health   Financial Resource Strain: Not on file   Food Insecurity: Not on file  Transportation Needs: Not on file  Physical Activity: Not on file  Stress: Not on file  Social Connections: Not on file    Additional Social History:   Allergies:   Allergies  Allergen Reactions   Lisinopril Other (See Comments)    "Makes me have to clear my throat a lot"    Metabolic Disorder Labs: Lab Results  Component Value Date   HGBA1C 6.7 (H) 08/23/2021   MPG 145.59 08/23/2021   No results found for: PROLACTIN Lab Results  Component Value Date   CHOL 132 08/23/2021   TRIG 248 (H) 08/23/2021   HDL 36 (L) 08/23/2021   CHOLHDL 3.7 08/23/2021   VLDL 50 (H) 08/23/2021   LDLCALC 46 08/23/2021   Lab Results  Component Value Date   TSH 1.789 08/23/2021    Therapeutic Level Labs: No results found for: LITHIUM No results found for: CBMZ No results found for: VALPROATE  Current Medications: Current Outpatient Medications  Medication Sig Dispense Refill   albuterol (VENTOLIN HFA) 108 (90 Base) MCG/ACT inhaler Inhale 2 puffs into the lungs every 6 (six) hours as needed for wheezing or shortness of breath.     amLODipine (NORVASC) 5 MG tablet Take 5 mg by mouth daily.     busPIRone (BUSPAR) 10 MG tablet Take 10 mg by mouth 2 (two) times daily as needed (For anxiety).     clonazePAM (KLONOPIN) 1 MG tablet Take 1 mg by mouth in the morning and at bedtime.     hydrOXYzine (VISTARIL) 25 MG capsule Take 1 capsule (25 mg total) by mouth 3 (three) times daily as needed. 60 capsule 0   ibuprofen (ADVIL) 200 MG tablet Take 800 mg by mouth in the morning and at bedtime.     lamoTRIgine 50 MG TBDP Take 50 mg by mouth at bedtime.     losartan-hydrochlorothiazide (HYZAAR) 100-25 MG tablet Take 1 tablet by mouth daily.     melatonin 5 MG TABS Take 5-10 mg by mouth at bedtime.     metFORMIN (GLUCOPHAGE) 1000 MG tablet Take 1,000 mg by mouth at bedtime.     metoprolol tartrate (LOPRESSOR) 50 MG tablet Take 50 mg by mouth in the morning and at bedtime.      Multiple Vitamins-Minerals (AIRBORNE GUMMIES PO) Take 2 tablets by mouth daily as needed (For immune health).     PARoxetine (PAXIL) 20 MG tablet Take 20 mg by mouth in the morning and at bedtime.     risperiDONE (RISPERDAL) 1 MG tablet Take 2 tablets (2 mg total) by mouth at bedtime. 60 tablet 0   traZODone (DESYREL) 100 MG tablet Take 200 mg by mouth at bedtime.     No current facility-administered medications for this visit.    Musculoskeletal: Strength & Muscle Tone: within normal limits Gait & Station: normal Patient leans: N/A  Psychiatric Specialty Exam: Review of Systems  There were no vitals taken for this visit.There is no height or weight on file to calculate BMI.  General Appearance: Casual  Eye Contact:  Good  Speech:  Clear and Coherent  Volume:  Normal  Mood:  Anxious and Depressed  Affect:  Congruent  Thought Process:  Coherent  Orientation:  Full (Time, Place, and Person)  Thought Content:  Logical  Suicidal Thoughts:  No  Homicidal Thoughts:  No  Memory:  Immediate;   Good Recent;   Good  Judgement:  Good  Insight:  Good  Psychomotor Activity:  Normal  Concentration:  Concentration: Good  Recall:  Good  Fund of Knowledge:Good  Language: Good  Akathisia:  No  Handed:  Right  AIMS (if indicated):  done  Assets:  Communication Skills Desire for Improvement Social Support  ADL's:  Intact  Cognition: WNL  Sleep:  Good   Screenings: Web designer from 09/08/2021 in [place] Counselor from 08/30/2021 in [place] ED from 08/23/2021 in [facility]  PHQ-2 Total Score 5 6 4   PHQ-9 Total Score 22 24 11       Flowsheet Row Counselor from 09/08/2021 in [place] Counselor from 08/30/2021 in [place] ED from 08/23/2021 in [place] No Risk No Risk No Risk       Assessment and Plan:  Start partial hospitalization program 09/19/2021 -Continue medications as indicated we will make refills available -Patient to continue seeking outpatient providers for discharge follow-up  Treatment plan was reviewed and agreed upon by NP T. Bobby Rumpf and patient Jonathon Warren's need for  services   Derrill Center, NP 2/7/20233:27 PM

## 2021-09-19 NOTE — Addendum Note (Signed)
Addended by: [name] on: 09/19/2021 03:37 PM   Modules accepted: Orders

## 2021-09-19 NOTE — Progress Notes (Signed)
Virtual Visit via Video Note   I connected with Jonathon Warren on 09/19/21 at  9:00 AM EDT by a video enabled telemedicine application and verified that I am speaking with the correct person using two identifiers.   At orientation to the IOP program, Case Manager discussed the limitations of evaluation and management by telemedicine and the availability of in person appointments. The patient expressed understanding and agreed to proceed with virtual visits throughout the duration of the program.   Location:  Patient: Patient Home Provider: OPT BH Office   History of Present Illness: Bipolar Disorder and GAD   Observations/Objective: Check In: Case Manager checked in with all participants to review discharge dates, insurance authorizations, work-related documents and needs from the treatment team regarding medications. Jonathon Warren stated needs and engaged in discussion.    Initial Therapeutic Activity: Counselor facilitated a check-in with Jonathon Warren to assess for safety, sobriety and medication compliance.  Counselor also inquired about Jonathon Warren's current emotional ratings, as well as any significant changes in thoughts, feelings or behavior since previous check in.  Jonathon Warren presented for session on time and was alert, oriented x5, with no evidence or self-report of active SI/HI or A/V H.  Jonathon Warren reported compliance with medication and denied use of alcohol or illicit substances.  Jonathon Warren reported scores of 4/10 for depression, 7/10 for anxiety, and 1/10 for anger/irritability.  Jonathon Warren denied any recent outbursts.  Jonathon Warren reported that a recent success was completing PHP program and making the decision to continue group therapy by transitioning to MHIOP today.  Jonathon Warren reported that a recent struggle was experiencing a few panic attacks over the past day, although he is working with his family to look out for warning signs so that can intervene and assist.  Jonathon Warren reported that his goal today is to keep his anxiety low.       Second Therapeutic Activity: Counselor engaged the group in discussion on managing work/life balance today to improve mental health and wellness.  Counselor explained how finding balance between responsibilities at home and work place can be challenging, lead to increased stress, and this has been further complicated by recent pandemic leading to unemployment, more virtual work, and blurring of lines between home as a place of rest or work duties.  Counselor facilitated discussion on what challenges members have faced with this issue historically, as well as what, if any, issues have arisen following pandemic. Counselor also discussed strategies for improving work/life balance while members work on their mental health during treatment.  Some of these included keeping track of time management; creating a list of priorities and scaling importance; setting realistic, measurable goals each day; establishing boundaries; taking care of health needs; and nurturing relationships at home and work for support.  Counselor inquired about areas where members feel they are excelling, as well as areas they could focus on during treatment. 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  He stated Before all this I was excellent at time management, but now simple tasks are too much and I can't do previous activities because of my anxiety.  Jonathon Warren reported that he experienced numerous signs of burnout over time from his job duties, including feeling tired and drained regularly, getting less sleep, lacking motivation, and isolating from others.  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  Jonathon Warren reported that he would work to correct work life  imbalance by preparing a to-do list each day to plan out a reasonable amount of tasks to take on, prioritize more quality time with his family in schedule, and set a realistic boundary to avoid working through lunch breaks, stating I need to spend more time on my needs.    Assessment and Plan: Counselor recommends that Jonathon Warren remain in IOP treatment to better manage mental health symptoms, ensure stability and pursue completion of treatment plan goals. Counselor recommends adherence to crisis/safety plan, taking medications as prescribed, and following up with medical professionals if any issues arise.   Follow Up Instructions: Counselor will send Webex link for next session. Jonathon Warren was advised to call back or seek an in-person evaluation if the symptoms worsen or if the condition fails to improve as anticipated.   I provided 180 minutes of non-face-to-face time during this encounter.   [name], LCSW, LCAS 09/19/21

## 2021-09-20 ENCOUNTER — Other Ambulatory Visit (HOSPITAL_COMMUNITY): Payer: PRIVATE HEALTH INSURANCE | Admitting: Psychiatry

## 2021-09-20 ENCOUNTER — Telehealth (HOSPITAL_COMMUNITY): Payer: Self-pay | Admitting: Psychiatry

## 2021-09-20 DIAGNOSIS — F3132 Bipolar disorder, current episode depressed, moderate: Secondary | ICD-10-CM | POA: Diagnosis not present

## 2021-09-20 DIAGNOSIS — F411 Generalized anxiety disorder: Secondary | ICD-10-CM

## 2021-09-20 NOTE — Progress Notes (Signed)
Virtual Visit via Video Note   I connected with Jonathon Warren on 09/20/21 at  9:00 AM EDT by a video enabled telemedicine application and verified that I am speaking with the correct person using two identifiers.   At orientation to the IOP program, Case Manager discussed the limitations of evaluation and management by telemedicine and the availability of in person appointments. The patient expressed understanding and agreed to proceed with virtual visits throughout the duration of the program.   Location:  Patient: Patient Home Provider: [facility] [place] [facility]   History of Present Illness: Bipolar Disorder and GAD   Observations/Objective: Check In: Case Manager checked in with all participants to review discharge dates, insurance authorizations, work-related documents and needs from the treatment team regarding medications. Jonathon Warren stated needs and engaged in discussion.    Initial Therapeutic Activity: Counselor facilitated a check-in with Jonathon Warren to assess for safety, sobriety and medication compliance.  Counselor also inquired about Jonathon Warren's current emotional ratings, as well as any significant changes in thoughts, feelings or behavior since previous check in.  Jonathon Warren presented for session on time and was alert, oriented x5, with no evidence or self-report of active SI/HI or A/V H.  Jonathon Warren reported compliance with medication and denied use of alcohol or illicit substances.  Jonathon Warren reported scores of 5/10 for depression, 6/10 for anxiety, and 0/10 for anger/irritability.  Jonathon Warren denied any recent outbursts or panic attacks.  Jonathon Warren reported that a recent success was going out for dinner with his son yesterday, stating It was good, but I was very tired.  Jonathon Warren reported that a recent struggle was experiencing an 'anxiety spike' yesterday that did not appear to be triggered by anything in particular, stating It seems like it just happens sometimes.  Jonathon Warren reported that his goal today is to spend time with his oldest  son building Lego's, stating It keeps my mind distracted and completing a project is rewarding.       Second Therapeutic Activity: Counselor introduced Jonathon Warren, [employer] Pharmacist, to provide psychoeducation on topic of medication compliance with members today.  Jonathon Warren provided psychoeducation on classes of medications such as antidepressants, antipsychotics, what symptoms they are intended to treat, and any side effects one might encounter while on a particular prescription.  Time was allowed for clients to ask any questions they might have of [facility] regarding this specialty.  Intervention was effective, as evidenced by Jonathon Warren participating in discussion with speaker on the subject, reporting that he has been on klonopin for years, and was wondering if this was an appropriate choice for long term treatment since he is worried about how difficult it could be to eventually taper off.  Crews was receptive towards feedback from pharmacist on proper use of this particular medication, and alternatives that could explored with monitoring from his prescriber.    Third Therapeutic Activity: Counselor provided demonstration of relaxation technique known as mindful breathing to help members increase sense of calm, resiliency, and control.  Counselor guided members through process of getting comfortable, achieving a relaxed breathing rhythm, and focusing on this for several minutes, allowing troubling thoughts and feelings to come and go without rumination.  Counselor processed effectiveness of activity afterward in discussion with members, including how this impacted their mental state, whether it was difficult to stay focused, and if they plan to include it in self-care routine to improve day-to-day coping.  Intervention was effective, as evidenced by Jonathon Warren participating in exercise successfully, and reporting that he enjoyed it and found it calming,  stating I don't know about being able to do it by myself, since I'm  not disciplined enough to focus myself back if my mind wanders.     Assessment and Plan: Counselor recommends that Jonathon Warren remain in IOP treatment to better manage mental health symptoms, ensure stability and pursue completion of treatment plan goals. Counselor recommends adherence to crisis/safety plan, taking medications as prescribed, and following up with medical professionals if any issues arise.   Follow Up Instructions: Counselor will send Webex link for next session. Jonathon Warren was advised to call back or seek an in-person evaluation if the symptoms worsen or if the condition fails to improve as anticipated.   I provided 180 minutes of non-face-to-face time during this encounter.   Shade Flood, LCSW, LCAS 09/20/21

## 2021-09-20 NOTE — Progress Notes (Signed)
Virtual Visit via Video Note  I connected with [name] on @TODAY @ at  9:00 AM EST by a video enabled telemedicine application and verified that I am speaking with the correct person using two identifiers.  Location: Patient: at home Provider: at office   I discussed the limitations of evaluation and management by telemedicine and the availability of in person appointments. The patient expressed understanding and agreed to proceed.    I discussed the assessment and treatment plan with the patient. The patient was provided an opportunity to ask questions and all were answered. The patient agreed with the plan and demonstrated an understanding of the instructions.   The patient was advised to call back or seek an in-person evaluation if the symptoms worsen or if the condition fails to improve as anticipated.  I provided 20 minutes of non-face-to-face time during this encounter.   , M.Ed,CNA   Patient ID: Jonathon Warren, male   DOB: 22-May-1981, 41 y.o.   MRN: [id number] As per previous CCA states:  "Pt reports per [facility]. Pts wife is present for CCA at patient request. Pt reports he woke up last Wednesday and had a major panic attack, feelings of guilt, feelings of worthlessness. Pt reports agitation and anxiety around others. Pt reports paranoia, including thinking wife is cheating, though pt reports he knows these things are not true. 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 Pt reports he had to go home early on Monday 1/9 and called out Tuesday 1/10. Pt is concerned about how job will react. Pt is concerned 12hr shifts are too much for him and would like 8hr shifts. 2) Family: My wife and youngest dont get along and that stresses me out. 3) Flu Shot: Pt reports he has a piece of metal stuck in his arm due to a flu shot and workers comp denied him. 4) Back pain: Pt reports he caught a resident when falling a few years ago and my back  slipped out.  Pt reports the last time he had increased panic attacks and felt this way was 7-8years ago when he was hospitalized at [facility]. Pt reports I dont want to end up in the hospital again. Pt denies any other MH treatment. Pt denies suicide attempts or self-harm acts. Pt has an attack during the assessment; wife able to help calm him down by telling him youre safe, youre loved. Pt denies current SI/HI/AVH.   Current Symptoms/Problems: Increased irritability; sleep issues- pt reports trouble falling asleep without Trazadone; anhedonia; I shut down.; increased isolation; hard to get out of bed; decreased motivation; racing thoughts; rabbit holing- cognitive distortions; feelings of being overwhelmed; feelings of hopelessness/worthlessness; decreased concentration; increased anxiety; hyperventalating: increased tearfulness; panic attacks- every day, multiple times, 73min-1hr- pt unaware of triggers; decreased ADLs- household, somewhat decreased with showering  Pt transitioned to MH-IOP on 09-19-21.  Reports PHP was ok; except for c/o certain peer who was very negative.  On a scale of 1-10 (10 being worst); pt rates his depression at a 4 and anxiety at a 7.  Denies SI/HI or A/V hallucinations. Cc: stressors above. A:  Oriented pt.  Pt gave verbal consent for tx, to release chart information to referred providers and to complete any forms if needed.  Pt also gave consent for attending group virtually d/t COVID social distancing restrictions.  Encouraged support groups.  Pt to be referred to a psychiatrist and a therapist.  Pt will improve mood  as evidenced by being happy again, managing his mood and coping with daily stressors for 5 out of 7 days for 60 days. R:  Pt receptive.  [name], M.Ed,CNA

## 2021-09-20 NOTE — Telephone Encounter (Signed)
D:  Pt inquiring about long term disability.  A:  Placed call to pt to inform him that MH-IOP only completes short term disability forms while patients are in MH-IOP.  "Someone told me that I would be getting a list of steps of what to do to apply for LTD."  Pt couldn't remember who told him that.  Informed pt the case mgr will inquire about LTD with clinic nursing team to see what they suggest.  Also, encouraged pt to f/u with his PCP or outside psychiatrist.  R:  Pt receptive.

## 2021-09-21 ENCOUNTER — Other Ambulatory Visit (HOSPITAL_COMMUNITY): Payer: PRIVATE HEALTH INSURANCE | Admitting: Licensed Clinical Social Worker

## 2021-09-21 ENCOUNTER — Other Ambulatory Visit: Payer: Self-pay

## 2021-09-21 DIAGNOSIS — F411 Generalized anxiety disorder: Secondary | ICD-10-CM

## 2021-09-21 DIAGNOSIS — F3132 Bipolar disorder, current episode depressed, moderate: Secondary | ICD-10-CM

## 2021-09-21 NOTE — Progress Notes (Signed)
Virtual Visit via Video Note   I connected with Jonathon Warren on 09/21/21 at  9:00 AM EDT by a video enabled telemedicine application and verified that I am speaking with the correct person using two identifiers.   At orientation to the IOP program, Case Manager discussed the limitations of evaluation and management by telemedicine and the availability of in person appointments. The patient expressed understanding and agreed to proceed with virtual visits throughout the duration of the program.   Location:  Patient: Patient Home Provider: [facility] [place] [facility]   History of Present Illness: Bipolar Disorder and GAD   Observations/Objective: Check In: Case Manager checked in with all participants to review discharge dates, insurance authorizations, work-related documents and needs from the treatment team regarding medications. Jonathon Warren stated needs and engaged in discussion.    Initial Therapeutic Activity: Counselor facilitated a check-in with Jonathon Warren to assess for safety, sobriety and medication compliance.  Counselor also inquired about Jonathon Warren's current emotional ratings, as well as any significant changes in thoughts, feelings or behavior since previous check in.  Jonathon Warren presented for session on time and was alert, oriented x5, with no evidence or self-report of active SI/HI or A/V H.  Jonathon Warren reported compliance with medication and denied use of alcohol or illicit substances.  Jonathon Warren reported scores of 3/10 for depression, 6/10 for anxiety, and 0/10 for anger/irritability.  Jonathon Warren denied any recent outbursts or panic attacks.  Jonathon Warren reported that a recent success was hanging out with his sons yesterday building Legos.  Jonathon Warren reported that a recent struggle was worrying about his mother in law, who went through back surgery.  Jonathon Warren reported that his goal today is to get some rest since he didn't sleep well last night.         Second Therapeutic Activity: Counselor covered topic of core beliefs with group today.   Counselor virtually shared a handout on the subject, which explained how everyone looks at the world differently, and two people can have the same experience, but have different interpretations of what happened.  Members were encouraged to think of these like sunglasses with different shades influencing perception towards positive or negative outcomes.  Examples of negative core beliefs were provided, such as I'm unlovable, I'm not good enough, and I'm a bad person.  Members were asked to share which one(s) they could relate to, and then identify evidence which contradicts these beliefs.  Intervention was effective, as evidenced by Jonathon Warren successfully participating in discussion on the subject and reporting that he could relate to several negative core beliefs listed on the handout, such as I am trapped and I am undeserving.  Jonathon Warren reported that negative core beliefs have led to consequences such as difficulty trusting others, moodiness, heightened depression, anxiety, and low self-esteem.  Jonathon Warren was able to successfully challenge these beliefs, reporting that he has 'put in a lot of work' recently towards being present and strengthening relationships with his children, and continuing with group therapy in order to learn new coping skills to deal with everyday challenges and triggers that influence his anxiety.    Assessment and Plan: Counselor recommends that Jonathon Warren remain in IOP treatment to better manage mental health symptoms, ensure stability and pursue completion of treatment plan goals. Counselor recommends adherence to crisis/safety plan, taking medications as prescribed, and following up with medical professionals if any issues arise.   Follow Up Instructions: Counselor will send Webex link for next session. Jonathon Warren was advised to call back or seek an in-person evaluation if the  symptoms worsen or if the condition fails to improve as anticipated.   I provided 180 minutes of non-face-to-face  time during this encounter.   Shade Flood, LCSW, LCAS 09/21/21

## 2021-09-22 ENCOUNTER — Other Ambulatory Visit (HOSPITAL_COMMUNITY): Payer: PRIVATE HEALTH INSURANCE | Admitting: Licensed Clinical Social Worker

## 2021-09-22 DIAGNOSIS — F3132 Bipolar disorder, current episode depressed, moderate: Secondary | ICD-10-CM | POA: Diagnosis not present

## 2021-09-22 DIAGNOSIS — F411 Generalized anxiety disorder: Secondary | ICD-10-CM

## 2021-09-22 NOTE — Progress Notes (Signed)
Virtual Visit via Video Note   I connected with Jonathon Warren on 09/22/21 at  9:00 AM EDT by a video enabled telemedicine application and verified that I am speaking with the correct person using two identifiers.   At orientation to the IOP program, Case Manager discussed the limitations of evaluation and management by telemedicine and the availability of in person appointments. The patient expressed understanding and agreed to proceed with virtual visits throughout the duration of the program.   Location:  Patient: Patient Home Provider: [facility] [place] [facility]   History of Present Illness: Bipolar Disorder and GAD   Observations/Objective: Check In: Case Manager checked in with all participants to review discharge dates, insurance authorizations, work-related documents and needs from the treatment team regarding medications. Jonathon Warren stated needs and engaged in discussion.    Initial Therapeutic Activity: Counselor facilitated a check-in with Jonathon Warren to assess for safety, sobriety and medication compliance.  Counselor also inquired about Jonathon Warren's current emotional ratings, as well as any significant changes in thoughts, feelings or behavior since previous check in.  Jonathon Warren presented for session on time and was alert, oriented x5, with no evidence or self-report of active SI/HI or A/V H.  Jonathon Warren reported compliance with medication and denied use of alcohol or illicit substances.  Jonathon Warren reported scores of 2/10 for depression, 3/10 for anxiety, and 0/10 for anger/irritability.  Jonathon Warren denied any recent outbursts or panic attacks.  Jonathon Warren reported that a recent success was spending time with his youngest son, and supporting his mother in law.  He stated Shes doing well after back surgery.  Jonathon Warren denied any new struggles.  Jonathon Warren reported that his goal today is to take things easy, and spend time with his wife, stating Today feels like a good day.         Second Therapeutic Activity: Counselor offered to teach group  members an ACT relaxation technique today to aid in managing difficult thoughts, feelings, urges, and sensations.  Counselor guided members through process of getting comfortable, achieving relaxing breathing rhythm, and then maintaining this throughout activity.  Counselor invited members to imagine a gently flowing stream in their mind with leaves floating upon it, and when any thoughts, feelings, urges, or sensations arose, good or bad, they were instructed to visualize placing them on these passing leaves over course of 10 minutes practice.  Intervention was effective, as evidenced by Jonathon Warren successfully participating in activity and reporting that he enjoyed it, and would plan to practice it to improve coping ability.  Third Therapeutic Activity: Psycho-educational portion of group was provided by Jonathon Warren, Interior and spatial designer of community education with [employer].  Jonathon Warren provided information on history of her local agency, mission statement, and the variety of unique services offered which group members might find beneficial to engage in, including both virtual and in-person support groups, as well as peer support program for mentoring.  Jonathon Warren offered time to answer member's questions regarding services and encouraged them to consider utilizing these services to assist in working towards their individual wellness goals.  Intervention effectiveness could not be measured, as Jonathon Warren did not participate in discussion, but was observed actively following along during presentation.    Assessment and Plan: Counselor recommends that Jonathon Warren remain in IOP treatment to better manage mental health symptoms, ensure stability and pursue completion of treatment plan goals. Counselor recommends adherence to crisis/safety plan, taking medications as prescribed, and following up with medical professionals if any issues arise.   Follow Up Instructions: Counselor will send Webex link  for next session. Jonathon Warren was  advised to call back or seek an in-person evaluation if the symptoms worsen or if the condition fails to improve as anticipated.   I provided 180 minutes of non-face-to-face time during this encounter.   Jonathon Stain, LCSW, LCAS 09/22/21

## 2021-09-25 ENCOUNTER — Other Ambulatory Visit (HOSPITAL_COMMUNITY): Payer: PRIVATE HEALTH INSURANCE | Admitting: Licensed Clinical Social Worker

## 2021-09-25 ENCOUNTER — Other Ambulatory Visit: Payer: Self-pay

## 2021-09-25 DIAGNOSIS — F411 Generalized anxiety disorder: Secondary | ICD-10-CM

## 2021-09-25 DIAGNOSIS — F3132 Bipolar disorder, current episode depressed, moderate: Secondary | ICD-10-CM

## 2021-09-25 NOTE — Progress Notes (Signed)
Virtual Visit via Video Note   I connected with [name] on 09/25/21 at  9:00 AM EDT by a video enabled telemedicine application and verified that I am speaking with the correct person using two identifiers.   At orientation to the IOP program, Case Manager discussed the limitations of evaluation and management by telemedicine and the availability of in person appointments. The patient expressed understanding and agreed to proceed with virtual visits throughout the duration of the program.   Location:  Patient: Patient Home Provider: Clin[facility] Office   History of Present Illness: Bipolar Disorder and GAD   Observations/Objective: Check In: Case Manager checked in with all participants to review discharge dates, insurance authorizations, work-related documents and needs from the treatment team regarding medications. Sacha stated needs and engaged in discussion.    Initial Therapeutic Activity: Counselor facilitated a check-in with Amer to assess for safety, sobriety and medication compliance.  Counselor also inquired about Hector's current emotional ratings, as well as any significant changes in thoughts, feelings or behavior since previous check in.  Fielding presented for session on time and was alert, oriented x5, with no evidence or self-report of active SI/HI or A/V H.  Rhet reported compliance with medication and denied use of alcohol or illicit substances.  Jesusmanuel reported scores of 7/10 for depression, 8/10 for anxiety, and 0/10 for anger/irritability.  Blas denied any recent outbursts.  Lynwood denied any recent successes.  Franke reported that a recent struggle was having his mother in law taken to the ER over the weekend, stating Her O2 was down, but shes at least out of the ICU now.  He reported that the stress from this event did trigger some panic attacks.  Kindrick reported that his goal today is to visit her at the hospital later and then prepare for his trip to [place]  tomorrow for a hockey game.       Second Therapeutic Activity: Counselor introduced topic of self-esteem today and defined this as the value an individual places on oneself, based upon assessment of personal worth as a human being and approval/disapproval of one's behavior. Counselor asked members to assess their level of self-esteem at this time based upon common indicators of high self-esteem, including: accepting oneself unconditionally;  having self-respect and deep seated belief that one matters; being unaffected by other people's opinions/criticisms; and showing good control over emotions.  Counselor also explained concept of one's inner critic which serves to highlight faults and minimize strengths, directly influencing low sense of self-esteem.  Counselor then provided handout on 'strengths and qualities', which featured questions to guide discussion and increase awareness of each member's unique individual abilities which could reinforce higher self-esteem. Examples of questions included: 'things I am good at', 'challenges I have overcome', and 'what I like about myself'.  Intervention was effective, as evidenced by [name] reporting that he has a moderate level of self-esteem at this time, and found it helpful to engage in activity today, as it highlighted several strengths and qualities which will serve him well as he continues with treatment.  Keyshawn identified personal strengths such as spirituality, humor, gratitude, independence, leadership, honesty, empathy, kindness, love, forgiveness, and common sense.  He also reported that he is good at listening and supporting people that need help within his support network, overcame a rough childhood, likes how his eyes look, values his own intelligence and mind, and values his relationship with his higher power.  Tyrice reported that a recent demonstration of his value was  being there to support his mother in law following her medical crisis.    Assessment  and Plan: Counselor recommends that Bunny remain in IOP treatment to better manage mental health symptoms, ensure stability and pursue completion of treatment plan goals. Counselor recommends adherence to crisis/safety plan, taking medications as prescribed, and following up with medical professionals if any issues arise.   Follow Up Instructions: Counselor will send Webex link for next session. Tyreece was advised to call back or seek an in-person evaluation if the symptoms worsen or if the condition fails to improve as anticipated.   I provided 180 minutes of non-face-to-face time during this encounter.   [name], LCSW, LCAS 09/25/21

## 2021-09-26 ENCOUNTER — Ambulatory Visit (HOSPITAL_COMMUNITY): Payer: No Typology Code available for payment source

## 2021-09-27 ENCOUNTER — Ambulatory Visit (HOSPITAL_COMMUNITY): Payer: No Typology Code available for payment source

## 2021-09-28 ENCOUNTER — Other Ambulatory Visit (HOSPITAL_COMMUNITY): Payer: PRIVATE HEALTH INSURANCE | Admitting: Psychiatry

## 2021-09-28 DIAGNOSIS — F411 Generalized anxiety disorder: Secondary | ICD-10-CM

## 2021-09-28 DIAGNOSIS — F3132 Bipolar disorder, current episode depressed, moderate: Secondary | ICD-10-CM | POA: Diagnosis not present

## 2021-09-28 NOTE — Plan of Care (Signed)
Pt will improve his mood as evidenced by being happy again, managing his mood and coping with daily stressors for 5 out of 7 days for 60 days. °
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
## 2021-09-29 ENCOUNTER — Other Ambulatory Visit (HOSPITAL_COMMUNITY): Payer: PRIVATE HEALTH INSURANCE | Admitting: Licensed Clinical Social Worker

## 2021-09-29 DIAGNOSIS — F411 Generalized anxiety disorder: Secondary | ICD-10-CM

## 2021-09-29 DIAGNOSIS — F3132 Bipolar disorder, current episode depressed, moderate: Secondary | ICD-10-CM

## 2021-09-29 NOTE — Progress Notes (Signed)
Virtual Visit via Video Note   I connected with [name] on 09/29/21 at  9:00 AM EDT by a video enabled telemedicine application and verified that I am speaking with the correct person using two identifiers.   At orientation to the IOP program, Case Manager discussed the limitations of evaluation and management by telemedicine and the availability of in person appointments. The patient expressed understanding and agreed to proceed with virtual visits throughout the duration of the program.   Location:  Patient: [facility] Home Provider: [facility]   History of Present Illness: Bipolar Disorder and GAD   Observations/Objective: Check In: Case Manager checked in with all participants to review discharge dates, insurance authorizations, work-related documents and needs from the treatment team regarding medications. Donnivan stated needs and engaged in discussion.    Initial Therapeutic Activity: Counselor facilitated a check-in with Kekai to assess for safety, sobriety and medication compliance.  Counselor also inquired about Kirk's current emotional ratings, as well as any significant changes in thoughts, feelings or behavior since previous check in.  Kitt presented for session on time and was alert, oriented x5, with no evidence or self-report of active SI/HI or A/V H.  Euel reported compliance with medication and denied use of alcohol or illicit substances.  Eyad reported scores of 3/10 for depression, 5/10 for anxiety, and 0/10 for anger/irritability.  Kenyen denied any recent outbursts or panic attacks.  Nahome reported that a recent success was securing an appointment with a psychiatrist prior to discharge from [facility].  Santanna denied any present challenges.  Tyrique reported that his goal this weekend is to travel to [place] to see a hockey game with his son.         Second Therapeutic Activity: Counselor introduced topic of anger management today.  Counselor virtually shared a handout with  members on this subject featuring a variety of coping skills, and facilitated discussion on these approaches.  Examples included raising awareness of anger triggers, practicing deep breathing, keeping an anger log to better understand episodes, using diversion activities to distract oneself for 30 minutes, taking a time out when necessary, and being mindful of warning signs tied to thoughts or behavior.  Counselor inquired about which techniques group members have used before, what has proved to be helpful, what their unique warning signs might be, as well as what they will try out in the future to assist with de-escalation.  Intervention was effective, as evidenced by [name] participating in discussion on topic, reporting that he has had anger problems for some time now, and one of his goals for therapy is to learn to cope with this emotion in healthier ways.  He reported that some of his triggers include not feeling heard or listened to, tardiness, unfair treatment, feeling helpless or out of control, family arguments, and having work criticized.  Jamaury reported that some consequences of anger have included arguments with peers and management at work, as well as wear on his teeth from grinding them when upset.  Dontrelle reported that today's session was helpful for reinforcing healthier anger management strategies to cope with challenges, including setting boundaries with people that are disrespectful towards him, meditating with guided imagery videos, and talking about problems with supportive family or peers for feedback.    Assessment and Plan: Counselor recommends that Kwabena remain in IOP treatment to better manage mental health symptoms, ensure stability and pursue completion of treatment plan goals. Counselor recommends adherence to crisis/safety plan, taking medications as prescribed, and following  up with medical professionals if any issues arise.   Follow Up Instructions: Counselor will send Webex link  for next session. Ferrel was advised to call back or seek an in-person evaluation if the symptoms worsen or if the condition fails to improve as anticipated.   Collaboration of Care:   Medication Management AEB [name], NP                                           Case Manager AEB [name], CNA   Patient/Guardian was advised Release of Information must be obtained prior to any record release in order to collaborate their care with an outside provider. Patient/Guardian was advised if they have not already done so to contact the registration department to sign all necessary forms in order for [name] to release information regarding their care.   Consent: Patient/Guardian gives verbal consent for treatment and assignment of benefits for services provided during this visit. Patient/Guardian expressed understanding and agreed to proceed.  I provided 180 minutes of non-face-to-face time during this encounter.   [name], LCSW, LCAS 09/29/21

## 2021-10-02 ENCOUNTER — Other Ambulatory Visit (HOSPITAL_COMMUNITY): Payer: PRIVATE HEALTH INSURANCE | Admitting: Licensed Clinical Social Worker

## 2021-10-02 ENCOUNTER — Other Ambulatory Visit: Payer: Self-pay

## 2021-10-02 DIAGNOSIS — F411 Generalized anxiety disorder: Secondary | ICD-10-CM

## 2021-10-02 DIAGNOSIS — F3132 Bipolar disorder, current episode depressed, moderate: Secondary | ICD-10-CM | POA: Diagnosis not present
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
## 2021-10-03 ENCOUNTER — Other Ambulatory Visit (HOSPITAL_COMMUNITY): Payer: PRIVATE HEALTH INSURANCE | Admitting: Psychiatry

## 2021-10-03 DIAGNOSIS — F3132 Bipolar disorder, current episode depressed, moderate: Secondary | ICD-10-CM | POA: Diagnosis not present

## 2021-10-03 DIAGNOSIS — F411 Generalized anxiety disorder: Secondary | ICD-10-CM

## 2021-10-03 NOTE — Progress Notes (Signed)
Virtual Visit via Video Note   I connected with Jonathon Warren on 10/03/21 at  9:00 AM EDT by a video enabled telemedicine application and verified that I am speaking with the correct person using two identifiers.   At orientation to the IOP program, Case Manager discussed the limitations of evaluation and management by telemedicine and the availability of in person appointments. The patient expressed understanding and agreed to proceed with virtual visits throughout the duration of the program.   Location:  Patient: [facility] Home Provider: [facility]   History of Present Illness: Bipolar Disorder and GAD   Observations/Objective: Check In: Case Manager checked in with all participants to review discharge dates, insurance authorizations, work-related documents and needs from the treatment team regarding medications. Jonathon Warren stated needs and engaged in discussion.    Initial Therapeutic Activity: Counselor facilitated a check-in with Jonathon Warren to assess for safety, sobriety and medication compliance.  Counselor also inquired about Jonathon Warren's current emotional ratings, as well as any significant changes in thoughts, feelings or behavior since previous check in.  Jonathon Warren presented for session on time and was alert, oriented x5, with no evidence or self-report of active SI/HI or A/V H.  Jonathon Warren reported compliance with medication and denied use of alcohol or illicit substances.  Jonathon Warren reported scores of 3/10 for depression, 5/10 for anxiety, and 0/10 for anger/irritability.  Jonathon Warren denied any recent outbursts or panic attacks.  Jonathon Warren reported that a recent success was getting out of the house yesterday and having lunch with his son, stating It was a good day.  Jonathon Warren denied any present struggles.  Jonathon Warren reported that his goal is to spend time with his father today visiting from out of town.       Second Therapeutic Activity: Counselor introduced [employer], [facility] to provide psychoeducation on topic of  Grief and Loss with members today.  Jonathon Warren began discussion by checking in with the group about their baseline mood today, general thoughts on what grief means to them and how it has affected them personally in the past.  Jonathon Warren provided information on how the process of grief/loss can differ depending upon one's unique culture, and categories of loss one could experience (i.e. loss of a person, animal, relationship, job, identity, etc).  Jonathon Warren encouraged members to be mindful of how pervasive loss can be, and how to recognize signs which could indicate that this is having an impact on one's overall mental health and wellbeing.  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  He reported that one way he copes with loss is prayer and seeking guidance from his higher power, and stated I try not to think of what I lost, but what I gained, and that's why it affects me differently.  He was receptive to feedback from speaker regarding normalization of his unique feelings in the grieving process.    Assessment and Plan: Counselor recommends that [name] remain in IOP treatment to better manage mental health symptoms, ensure stability and pursue completion of treatment plan goals. Counselor recommends adherence to crisis/safety plan, taking medications as prescribed, and following up with medical professionals if any issues arise.   Follow Up Instructions: Counselor will send Webex link for next session. Jonathon Warren was advised to call back or seek an in-person evaluation  if the symptoms worsen or if the condition fails to improve as anticipated.   Collaboration of Care:   Medication Management AEB Jonathon Jacks, NP                                           Case Manager AEB [name], CNA   Patient/Guardian was advised Release of Information must be obtained prior to any record release in order to collaborate their care with an outside provider. Patient/Guardian was advised if they have not already done so to contact the registration department to sign all necessary forms in order for [name] to release information regarding their care.   Consent: Patient/Guardian gives verbal consent for treatment and assignment of benefits for services provided during this visit. Patient/Guardian expressed understanding and agreed to proceed.  I provided 180 minutes of non-face-to-face time during this encounter.   [name], LCSW, LCAS 10/03/21

## 2021-10-04 ENCOUNTER — Telehealth (HOSPITAL_COMMUNITY): Payer: Self-pay | Admitting: Psychiatry

## 2021-10-04 ENCOUNTER — Ambulatory Visit (HOSPITAL_COMMUNITY): Payer: No Typology Code available for payment source | Admitting: Psychiatry

## 2021-10-05 ENCOUNTER — Ambulatory Visit (HOSPITAL_COMMUNITY): Payer: No Typology Code available for payment source | Admitting: Psychiatry

## 2021-10-05 ENCOUNTER — Telehealth (HOSPITAL_COMMUNITY): Payer: Self-pay | Admitting: Psychiatry

## 2021-10-05 NOTE — Progress Notes (Signed)
°  [facility] Intensive Outpatient Program Discharge Summary  Jonathon Warren [id number]  Admission date: 09/19/2021 Discharge date: 10/05/2021  Reason for admission: Per admission assessment note: Nachum Derossett is a 41 y.o. Caucasian male presents with feelings of worthlessness and stress. He reported a history of depression and anxiety states he has been on multiple psychotropic medications in the past which she does not feel is helping his mood.  States he recently was seen and evaluated at the local urgent care due to reported withdrawal symptoms.  States a previous inpatient admission.  He reports he was initiated on Seroquel however states he does not like the way this medication makes him feel.  Progress in Program Toward Treatment Goals: Progressing-progress is ongoing.  Patient recently completed partial hospitalization programming.  Has requested to discharge from intensive outpatient programming, as it was reported patient attempted to establish care with outpatient provider.  No suicidal or homicidal ideations noted.  Patient was not followed by this practitioner at discharge.  We will make medication refills available until follow-up appointment.  Progress (rationale): -See case management discharge summary   Take all medications as prescribed. Keep all follow-up appointments as scheduled.  Do not consume alcohol or use illegal drugs while on prescription medications. Report any adverse effects from your medications to your primary care provider promptly.  In the event of recurrent symptoms or worsening symptoms, call 911, a crisis hotline, or go to the nearest emergency department for evaluation.    [name], NP 10/05/2021

## 2021-10-05 NOTE — Patient Instructions (Signed)
D:  Patient completed [facility]-IOP today.  A: Discharge from MH[facility]IOP.  Follow up with [facility] tomorrow (10-06-21).  Strongly recommended support groups through [facility] [phone]; especially the anger mgmt group.  R:  Patient receptive.
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
## 2021-10-06 ENCOUNTER — Ambulatory Visit (HOSPITAL_COMMUNITY): Payer: No Typology Code available for payment source

## 2021-10-09 ENCOUNTER — Ambulatory Visit (HOSPITAL_COMMUNITY): Payer: No Typology Code available for payment source

## 2021-10-10 ENCOUNTER — Ambulatory Visit (HOSPITAL_COMMUNITY): Payer: No Typology Code available for payment source

## 2021-12-25 ENCOUNTER — Telehealth (HOSPITAL_COMMUNITY): Payer: Self-pay | Admitting: Professional

## 2022-08-13 HISTORY — PX: BOWEL RESECTION: SHX1257
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
## 2023-01-30 IMAGING — CR DG SHOULDER 2+V*R*
3 series · 3 of 3 positions shown · non-contrast
Comparison: None.

CLINICAL DATA: Dog bite.

EXAM:
RIGHT SHOULDER - 2+ VIEW

[w shoulder external right]
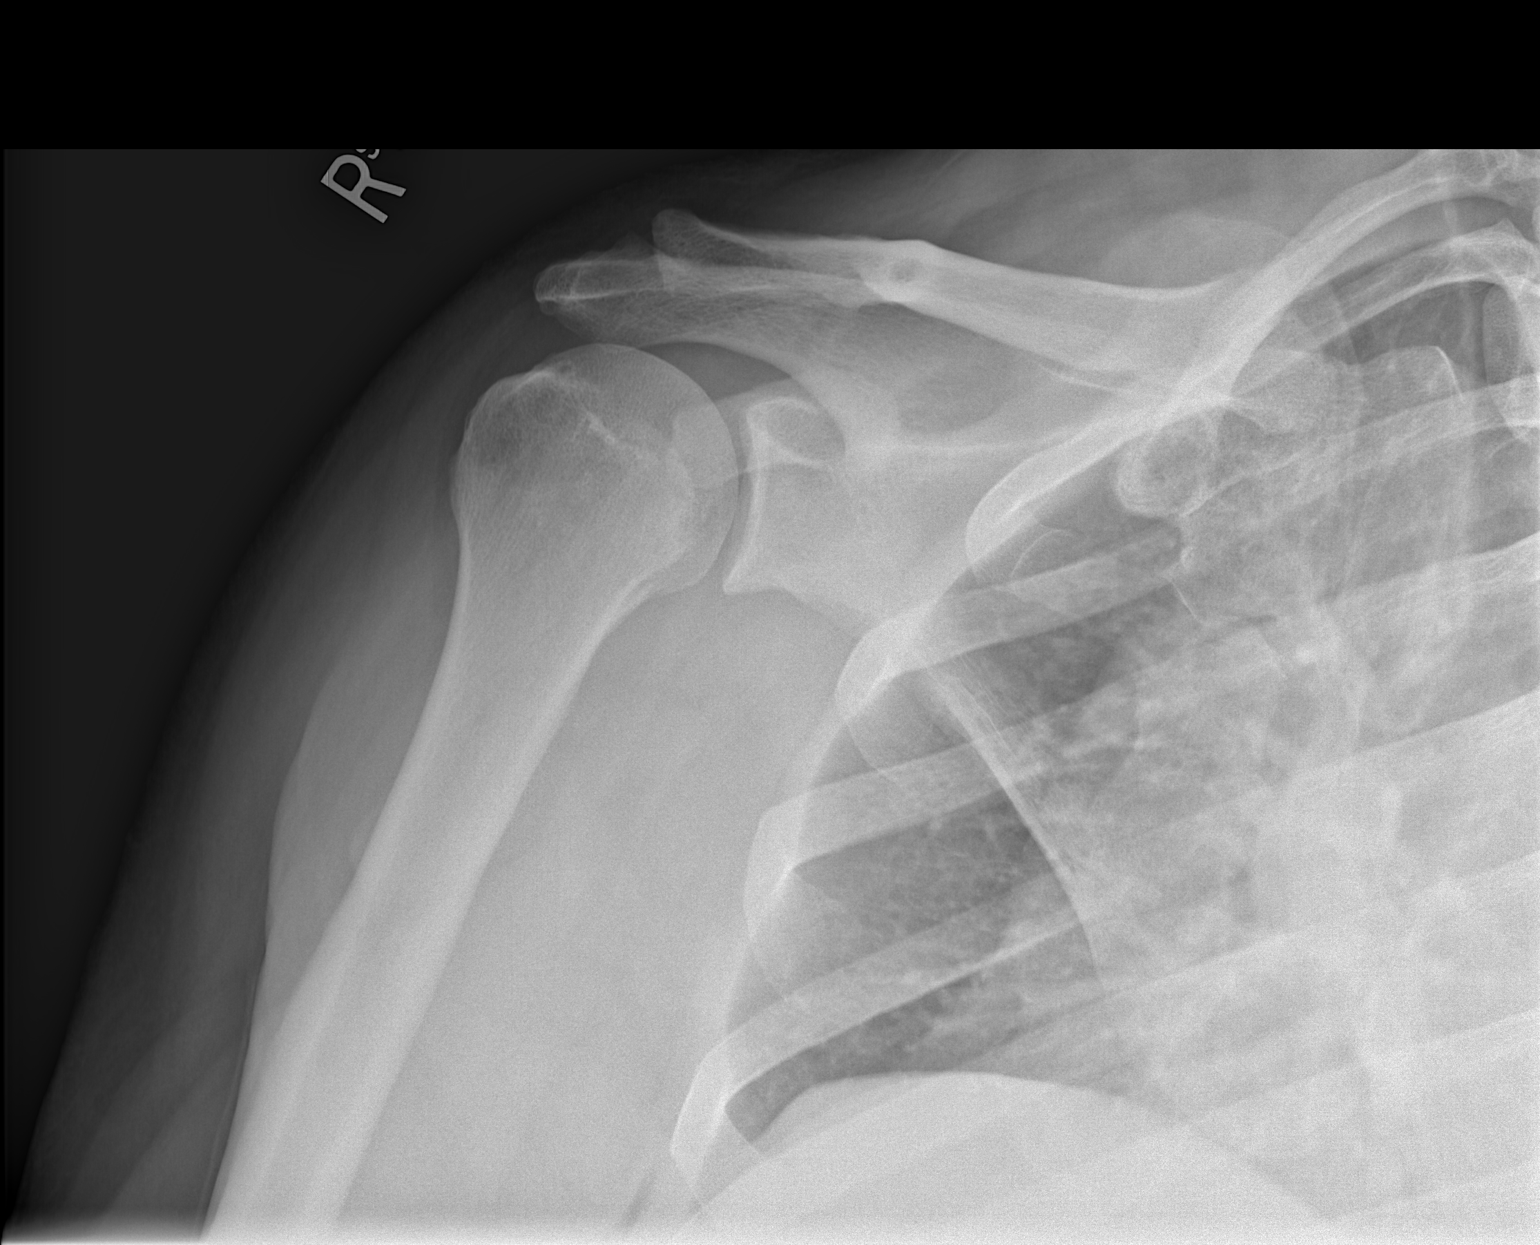

[w shoulder y-view right]
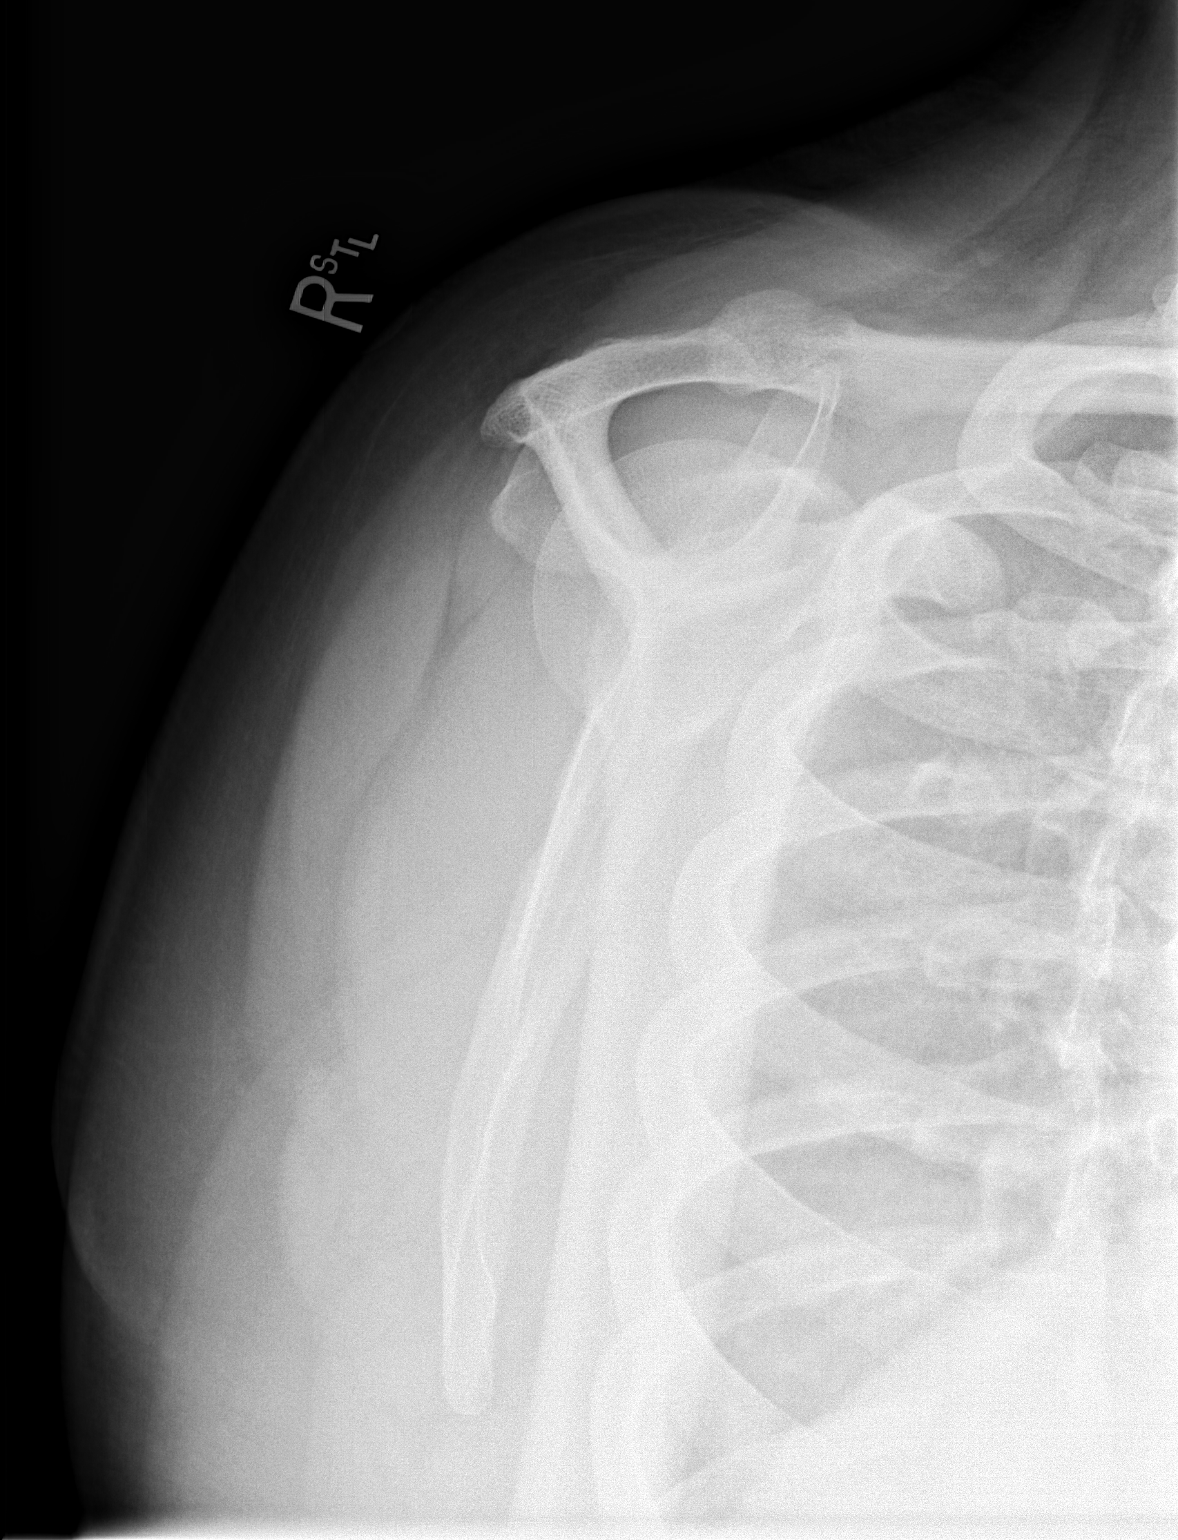

[x shoulder axillary right]
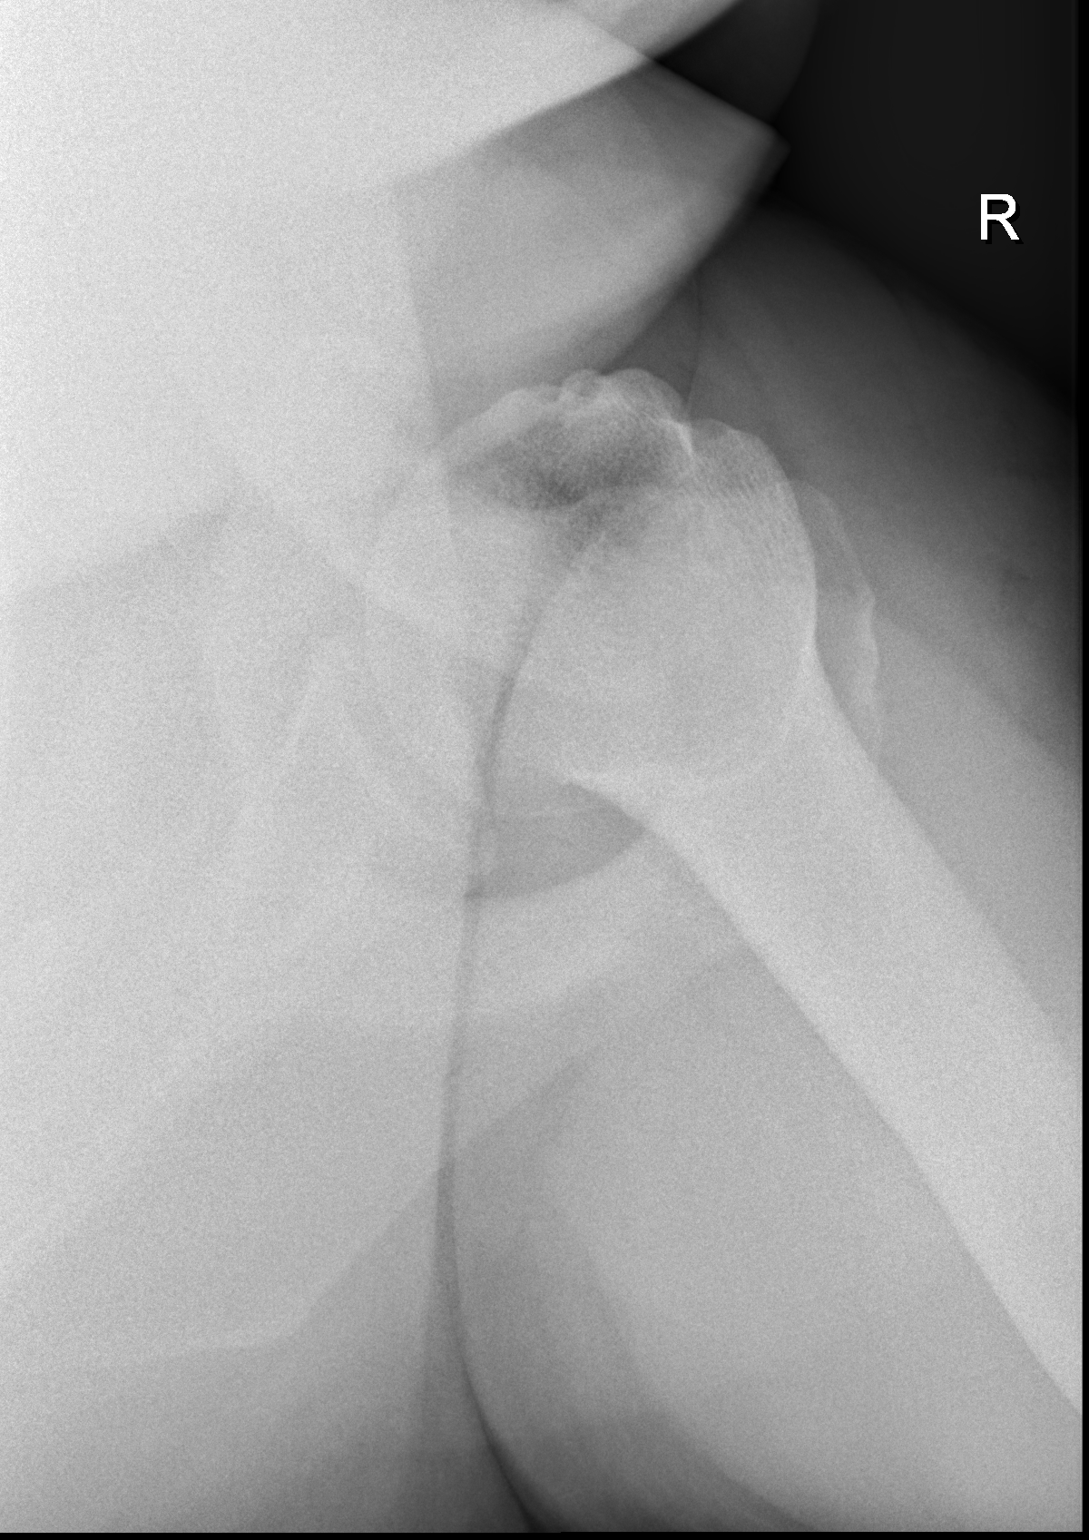

[3 of 3 positions shown; findings below may reference images not displayed]

FINDINGS: There is no evidence of fracture or dislocation. There is no
evidence of arthropathy or other focal bone abnormality. There is no
radiopaque foreign body. Soft tissues are unremarkable.
IMPRESSION: Negative.

## 2023-01-30 IMAGING — CR DG FINGER MIDDLE 2+V*R*
3 series · 3 of 3 positions shown · non-contrast
Comparison: None.

CLINICAL DATA: Dog bite.

EXAM:
RIGHT MIDDLE FINGER 2+V

[x finger pa right]
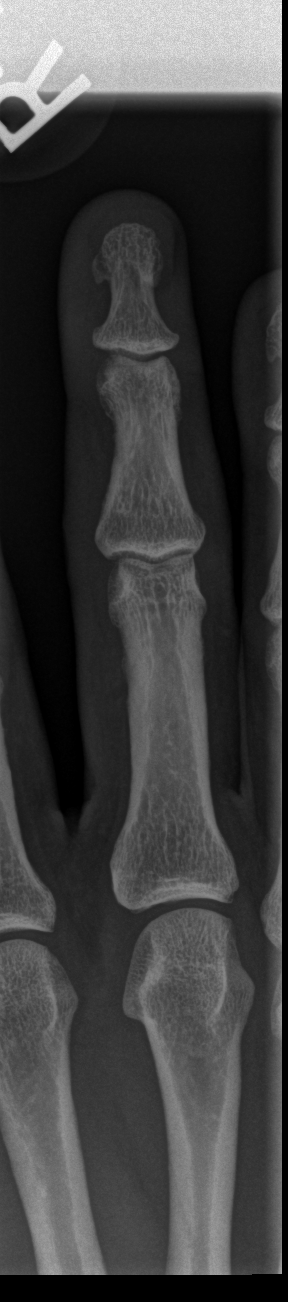

[x finger obl right]
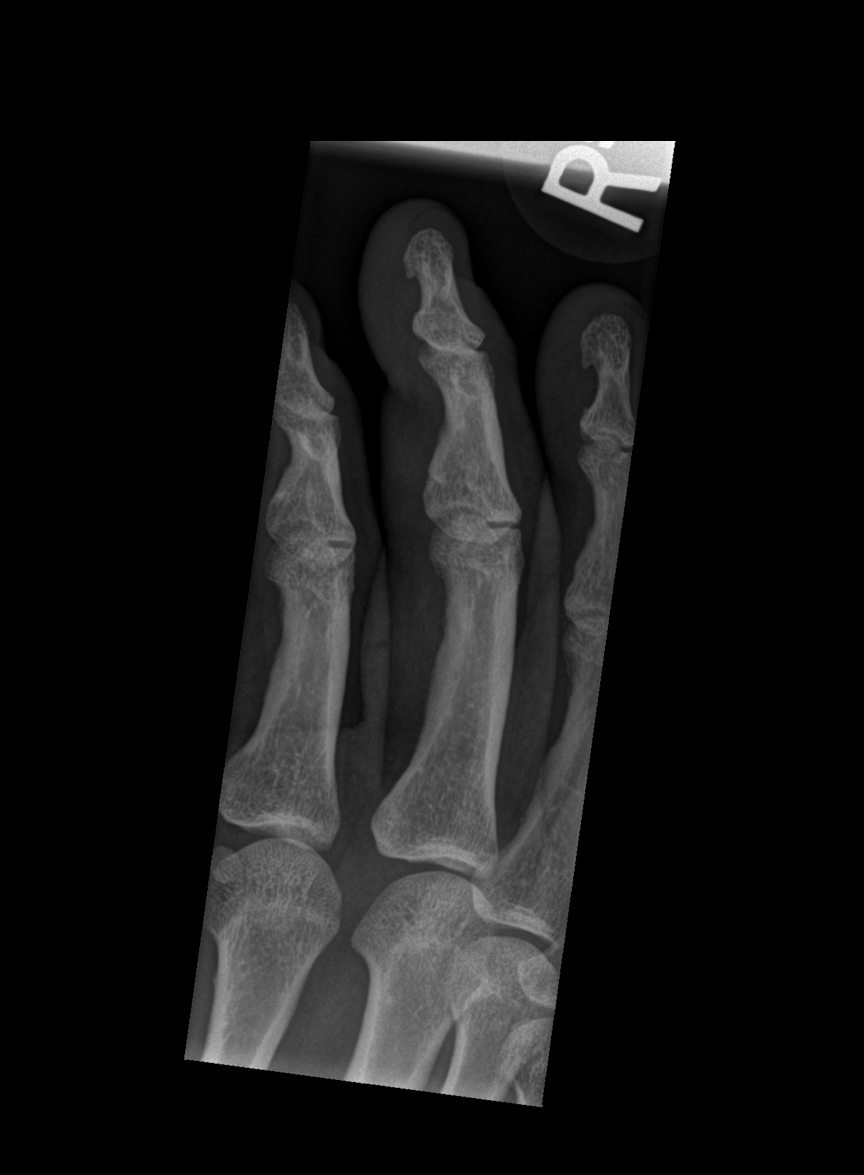

[x finger lat right]
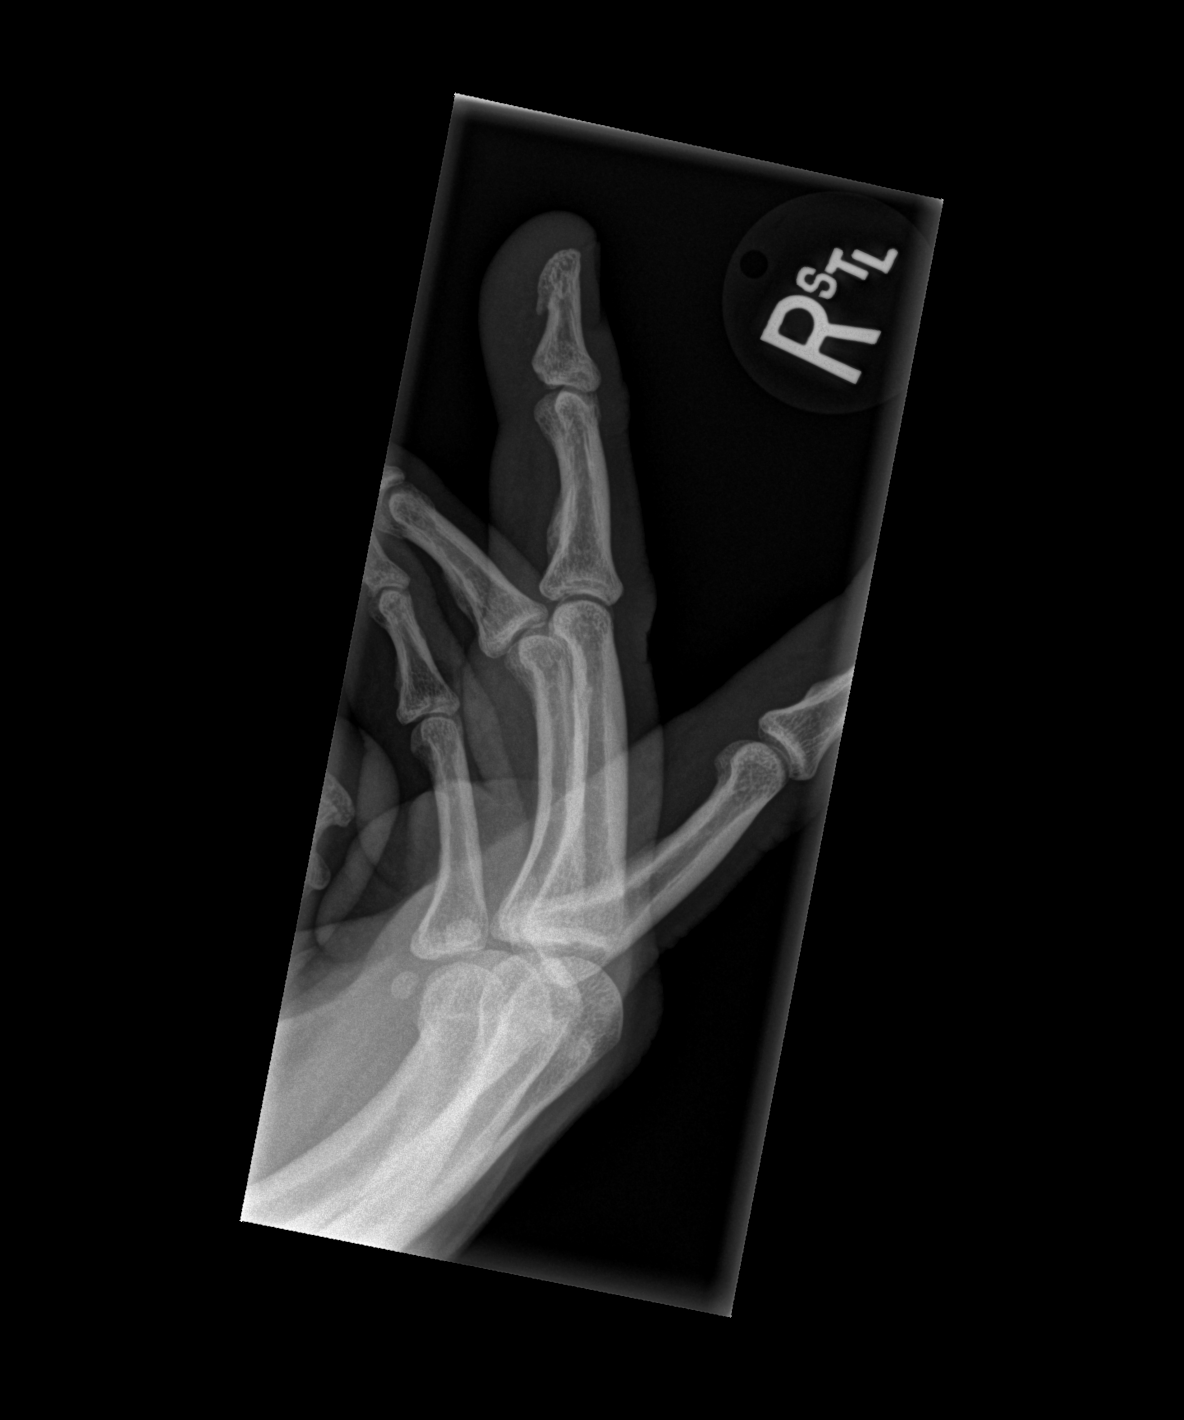

[3 of 3 positions shown; findings below may reference images not displayed]

FINDINGS: There is no evidence of fracture or dislocation. There is no
evidence of arthropathy or other focal bone abnormality. There is no
radiopaque foreign body. Soft tissues are unremarkable.
IMPRESSION: Negative.

## 2024-02-18 DIAGNOSIS — K580 Irritable bowel syndrome with diarrhea: Secondary | ICD-10-CM | POA: Diagnosis present

## 2024-02-18 DIAGNOSIS — K601 Chronic anal fissure: Secondary | ICD-10-CM | POA: Diagnosis present

## 2024-03-16 ENCOUNTER — Ambulatory Visit: Payer: Self-pay | Admitting: Surgery

## 2024-03-27 NOTE — Progress Notes (Signed)
 COVID Vaccine Completed:  Date of COVID positive in last 90 days:  PCP - [facility]- Worth Moloney, MD Cardiologist - n/a  Chest x-ray - N/A EKG - 03/30/24 Epic/chart Stress Test - N/A ECHO - 09/05/22 [facility] Cardiac Cath - n/a Pacemaker/ICD device last checked:N/A Spinal Cord Stimulator:N/A  Bowel Prep - Miralax and liquid diet day before, patient aware and has instructions   Sleep Study - N/A CPAP -   Fasting Blood Sugar - takes Metformin . No BS checks at home Checks Blood Sugar _____ times a day  Last dose of GLP1 agonist-  N/A GLP1 instructions:  Do not take after     Last dose of SGLT-2 inhibitors-  N/A SGLT-2 instructions:  Do not take after     Blood Thinner Instructions: N/A Last dose:   Time: Aspirin Instructions:N/A Last Dose:  Activity level: Can go up a flight of stairs and perform activities of daily living without stopping and without symptoms of chest pain or shortness of breath.   Anesthesia review: N/A  Patient denies shortness of breath, fever, cough and chest pain at PAT appointment  Patient verbalized understanding of instructions that were given to them at the PAT appointment. Patient was also instructed that they will need to review over the PAT instructions again at home before surgery.

## 2024-03-27 NOTE — Patient Instructions (Signed)
 SURGICAL WAITING ROOM VISITATION  Patients having surgery or a procedure may have no more than 2 support people in the waiting area - these visitors may rotate.    Children under the age of 94 must have an adult with them who is not the patient.  Visitors with respiratory illnesses are discouraged from visiting and should remain at home.  If the patient needs to stay at the hospital during part of their recovery, the visitor guidelines for inpatient rooms apply. Pre-op nurse will coordinate an appropriate time for 1 support person to accompany patient in pre-op.  This support person may not rotate.    Please refer to the [facility] website for the visitor guidelines for Inpatients (after your surgery is over and you are in a regular room).    Your procedure is scheduled on: 04/08/24   Report to [facility] Main Entrance    Report to admitting at 11:15 AM   Call this number if you have problems the morning of surgery [phone]   Follow a liquid diet the day before surgery.   After Midnight you may have the following liquids until 10:30 AM DAY OF SURGERY  Water Non-Citrus Juices (without pulp, NO RED-Apple, White grape, White cranberry) Black Coffee (NO MILK/CREAM OR CREAMERS, sugar ok)  Clear Tea (NO MILK/CREAM OR CREAMERS, sugar ok) regular and decaf                             Plain Jell-O (NO RED)                                           Fruit ices (not with fruit pulp, NO RED)                                     Popsicles (NO RED)                                                               Sports drinks like Gatorade (NO RED)                     If you have questions, please contact your surgeon's office.   FOLLOW BOWEL PREP AND ANY ADDITIONAL PRE OP INSTRUCTIONS YOU RECEIVED FROM YOUR SURGEON'S OFFICE!!! -Miralax     Oral Hygiene is also important to reduce your risk of infection.                                    Remember - BRUSH YOUR TEETH THE MORNING OF  SURGERY WITH YOUR REGULAR TOOTHPASTE  DENTURES WILL BE REMOVED PRIOR TO SURGERY PLEASE DO NOT APPLY Poly grip OR ADHESIVES!!!   Stop all vitamins and herbal supplements 7 days before surgery.   Take these medicines the morning of surgery with A SIP OF WATER: Buspirone , Clonazepam , Lamictal   DO NOT TAKE ANY ORAL DIABETIC MEDICATIONS DAY OF YOUR SURGERY  How to Manage Your Diabetes Before and After Surgery  Why is it important to control my blood sugar before and after surgery? Improving blood sugar levels before and after surgery helps healing and can limit problems. A way of improving blood sugar control is eating a healthy diet by:  Eating less sugar and carbohydrates  Increasing activity/exercise  Talking with your doctor about reaching your blood sugar goals High blood sugars (greater than 180 mg/dL) can raise your risk of infections and slow your recovery, so you will need to focus on controlling your diabetes during the weeks before surgery. Make sure that the doctor who takes care of your diabetes knows about your planned surgery including the date and location.  How do I manage my blood sugar before surgery? Check your blood sugar at least 4 times a day, starting 2 days before surgery, to make sure that the level is not too high or low. Check your blood sugar the morning of your surgery when you wake up and every 2 hours until you get to the Short Stay unit. If your blood sugar is less than 70 mg/dL, you will need to treat for low blood sugar: Do not take insulin. Treat a low blood sugar (less than 70 mg/dL) with  cup of clear juice (cranberry or apple), 4 glucose tablets, OR glucose gel. Recheck blood sugar in 15 minutes after treatment (to make sure it is greater than 70 mg/dL). If your blood sugar is not greater than 70 mg/dL on recheck, call [phone] for further instructions. Report your blood sugar to the short stay nurse when you get to Short Stay.  If you are  admitted to the hospital after surgery: Your blood sugar will be checked by the staff and you will probably be given insulin after surgery (instead of oral diabetes medicines) to make sure you have good blood sugar levels. The goal for blood sugar control after surgery is 80-180 mg/dL.   WHAT DO I DO ABOUT MY DIABETES MEDICATION?  Do not take oral diabetes medicines (pills) the morning of surgery.  THE DAY BEFORE SURGERY, take Metformin  as prescribed.     THE MORNING OF SURGERY, do not take Metformin .   Reviewed and Endorsed by [facility] Patient Education Committee, August 2015                              You may not have any metal on your body including jewelry, and body piercing             Do not wear lotions, powders, cologne, or deodorant              Men may shave face and neck.   Do not bring valuables to the hospital. [facility] IS NOT             RESPONSIBLE   FOR VALUABLES.   Contacts, glasses, dentures or bridgework may not be worn into surgery.   Bring small overnight bag day of surgery.   DO NOT BRING YOUR HOME MEDICATIONS TO THE HOSPITAL. PHARMACY WILL DISPENSE MEDICATIONS LISTED ON YOUR MEDICATION LIST TO YOU DURING YOUR ADMISSION IN THE HOSPITAL!    Patients discharged on the day of surgery will not be allowed to drive home.  Someone NEEDS to stay with you for the first 24 hours after anesthesia.              Please read over the following fact sheets you were given: IF YOU HAVE QUESTIONS  ABOUT YOUR PRE-OP INSTRUCTIONS PLEASE CALL [phone].   If you received a COVID test during your pre-op visit  it is requested that you wear a mask when out in public, stay away from anyone that may not be feeling well and notify your surgeon if you develop symptoms. If you test positive for Covid or have been in contact with anyone that has tested positive in the last 10 days please notify you surgeon.    [place] - Preparing for Surgery Before surgery, you  can play an important role.  Because skin is not sterile, your skin needs to be as free of germs as possible.  You can reduce the number of germs on your skin by washing with CHG (chlorahexidine gluconate) soap before surgery.  CHG is an antiseptic cleaner which kills germs and bonds with the skin to continue killing germs even after washing. Please DO NOT use if you have an allergy to CHG or antibacterial soaps.  If your skin becomes reddened/irritated stop using the CHG and inform your nurse when you arrive at Short Stay. Do not shave (including legs and underarms) for at least 48 hours prior to the first CHG shower.  You may shave your face/neck.  Please follow these instructions carefully:  1.  Shower with CHG Soap the night before surgery and the  morning of surgery.  2.  If you choose to wash your hair, wash your hair first as usual with your normal  shampoo.  3.  After you shampoo, rinse your hair and body thoroughly to remove the shampoo.                             4.  Use CHG as you would any other liquid soap.  You can apply chg directly to the skin and wash.  Gently with a scrungie or clean washcloth.  5.  Apply the CHG Soap to your body ONLY FROM THE NECK DOWN.   Do   not use on face/ open                           Wound or open sores. Avoid contact with eyes, ears mouth and   genitals (private parts).                       Wash face,  Genitals (private parts) with your normal soap.             6.  Wash thoroughly, paying special attention to the area where your    surgery  will be performed.  7.  Thoroughly rinse your body with warm water from the neck down.  8.  DO NOT shower/wash with your normal soap after using and rinsing off the CHG Soap.                9.  Pat yourself dry with a clean towel.            10.  Wear clean pajamas.            11.  Place clean sheets on your bed the night of your first shower and do not  sleep with pets. Day of Surgery : Do not apply any  lotions/deodorants the morning of surgery.  Please wear clean clothes to the hospital/surgery center.  FAILURE TO FOLLOW THESE INSTRUCTIONS MAY RESULT IN THE CANCELLATION OF YOUR  SURGERY  PATIENT SIGNATURE_________________________________  NURSE SIGNATURE__________________________________  ________________________________________________________________________

## 2024-03-30 ENCOUNTER — Encounter (HOSPITAL_COMMUNITY): Payer: Self-pay

## 2024-03-30 ENCOUNTER — Other Ambulatory Visit: Payer: Self-pay

## 2024-03-30 ENCOUNTER — Encounter (HOSPITAL_COMMUNITY)
Admission: RE | Admit: 2024-03-30 | Discharge: 2024-03-30 | Disposition: A | Discharge status: Home / Self Care | Payer: Self-pay | Source: Ambulatory Visit | Attending: Surgery | Admitting: Surgery

## 2024-03-30 VITALS — BP 138/96 | HR 92 | Temp 98.5°F | Resp 16 | Ht 73.0 in | Wt 284.0 lb

## 2024-03-30 DIAGNOSIS — Z01818 Encounter for other preprocedural examination: Secondary | ICD-10-CM | POA: Diagnosis not present

## 2024-03-30 DIAGNOSIS — Z01810 Encounter for preprocedural cardiovascular examination: Secondary | ICD-10-CM | POA: Diagnosis present

## 2024-03-30 DIAGNOSIS — Z01812 Encounter for preprocedural laboratory examination: Secondary | ICD-10-CM | POA: Diagnosis present

## 2024-03-30 DIAGNOSIS — E119 Type 2 diabetes mellitus without complications: Secondary | ICD-10-CM | POA: Diagnosis not present

## 2024-03-30 HISTORY — DX: Type 2 diabetes mellitus without complications: E11.9

## 2024-03-30 HISTORY — DX: Bipolar disorder, unspecified: F31.9

## 2024-03-30 HISTORY — DX: Essential (primary) hypertension: I10

## 2024-03-30 LAB — HEMOGLOBIN A1C
Hgb A1c MFr Bld: 7.5 % — ABNORMAL HIGH (ref 4.8–5.6)
Mean Plasma Glucose: 168.55 mg/dL

## 2024-03-30 LAB — CBC
HCT: 42.5 % (ref 39.0–52.0)
Hemoglobin: 14.6 g/dL (ref 13.0–17.0)
MCH: 28.7 pg (ref 26.0–34.0)
MCHC: 34.4 g/dL (ref 30.0–36.0)
MCV: 83.5 fL (ref 80.0–100.0)
Platelets: 282 K/uL (ref 150–400)
RBC: 5.09 MIL/uL (ref 4.22–5.81)
RDW: 12 % (ref 11.5–15.5)
WBC: 9.4 K/uL (ref 4.0–10.5)
nRBC: 0 % (ref 0.0–0.2)

## 2024-03-30 LAB — BASIC METABOLIC PANEL WITH GFR
Anion gap: 12 (ref 5–15)
BUN: 17 mg/dL (ref 6–20)
CO2: 21 mmol/L — ABNORMAL LOW (ref 22–32)
Calcium: 9.2 mg/dL (ref 8.9–10.3)
Chloride: 103 mmol/L (ref 98–111)
Creatinine, Ser: 1.23 mg/dL (ref 0.61–1.24)
GFR, Estimated: >60 mL/min (ref >60)
Glucose, Bld: 189 mg/dL — ABNORMAL HIGH (ref 70–99)
Potassium: 4 mmol/L (ref 3.5–5.1)
Sodium: 136 mmol/L (ref 135–145)

## 2024-03-30 LAB — GLUCOSE, CAPILLARY: Glucose-Capillary: 208 mg/dL — ABNORMAL HIGH (ref 70–99)

## 2024-04-08 ENCOUNTER — Other Ambulatory Visit: Payer: Self-pay

## 2024-04-08 ENCOUNTER — Ambulatory Visit (HOSPITAL_COMMUNITY)
Admission: RE | Admit: 2024-04-08 | Discharge: 2024-04-08 | Disposition: A | Discharge status: Home / Self Care | Payer: Self-pay | Attending: Surgery | Admitting: Surgery

## 2024-04-08 ENCOUNTER — Encounter (HOSPITAL_COMMUNITY): Payer: Self-pay | Admitting: Surgery

## 2024-04-08 ENCOUNTER — Encounter (HOSPITAL_COMMUNITY)
Admission: RE | Disposition: A | Discharge status: Home / Self Care | Payer: Self-pay | Source: Home / Self Care | Attending: Surgery

## 2024-04-08 ENCOUNTER — Ambulatory Visit (HOSPITAL_COMMUNITY): Admitting: Anesthesiology

## 2024-04-08 ENCOUNTER — Ambulatory Visit (HOSPITAL_BASED_OUTPATIENT_CLINIC_OR_DEPARTMENT_OTHER): Admitting: Anesthesiology

## 2024-04-08 DIAGNOSIS — K641 Second degree hemorrhoids: Secondary | ICD-10-CM

## 2024-04-08 DIAGNOSIS — F319 Bipolar disorder, unspecified: Secondary | ICD-10-CM | POA: Insufficient documentation

## 2024-04-08 DIAGNOSIS — Z833 Family history of diabetes mellitus: Secondary | ICD-10-CM | POA: Diagnosis not present

## 2024-04-08 DIAGNOSIS — G4733 Obstructive sleep apnea (adult) (pediatric): Secondary | ICD-10-CM | POA: Insufficient documentation

## 2024-04-08 DIAGNOSIS — Z6837 Body mass index (BMI) 37.0-37.9, adult: Secondary | ICD-10-CM | POA: Insufficient documentation

## 2024-04-08 DIAGNOSIS — K601 Chronic anal fissure: Secondary | ICD-10-CM | POA: Diagnosis present

## 2024-04-08 DIAGNOSIS — Z79899 Other long term (current) drug therapy: Secondary | ICD-10-CM | POA: Diagnosis not present

## 2024-04-08 DIAGNOSIS — K580 Irritable bowel syndrome with diarrhea: Secondary | ICD-10-CM | POA: Diagnosis present

## 2024-04-08 DIAGNOSIS — Z7984 Long term (current) use of oral hypoglycemic drugs: Secondary | ICD-10-CM | POA: Diagnosis not present

## 2024-04-08 DIAGNOSIS — E669 Obesity, unspecified: Secondary | ICD-10-CM | POA: Diagnosis not present

## 2024-04-08 DIAGNOSIS — I10 Essential (primary) hypertension: Secondary | ICD-10-CM | POA: Diagnosis not present

## 2024-04-08 DIAGNOSIS — F419 Anxiety disorder, unspecified: Secondary | ICD-10-CM | POA: Diagnosis not present

## 2024-04-08 DIAGNOSIS — E119 Type 2 diabetes mellitus without complications: Secondary | ICD-10-CM | POA: Diagnosis not present

## 2024-04-08 HISTORY — PX: HEMORRHOID SURGERY: SHX153

## 2024-04-08 HISTORY — PX: RECTAL EXAM UNDER ANESTHESIA: SHX6399

## 2024-04-08 HISTORY — PX: SPHINCTEROTOMY: SHX5279

## 2024-04-08 LAB — GLUCOSE, CAPILLARY
Glucose-Capillary: 165 mg/dL — ABNORMAL HIGH (ref 70–99)
Glucose-Capillary: 171 mg/dL — ABNORMAL HIGH (ref 70–99)

## 2024-04-08 SURGERY — SPHINCTEROTOMY, ANAL
Anesthesia: General | Site: Rectum

## 2024-04-08 MED ORDER — SUGAMMADEX SODIUM 200 MG/2ML IV SOLN
INTRAVENOUS | Status: AC
Start: 2024-04-08 — End: 2024-04-08
  Filled 2024-04-08: qty 2

## 2024-04-08 MED ORDER — FENTANYL CITRATE PF 50 MCG/ML IJ SOSY
PREFILLED_SYRINGE | INTRAMUSCULAR | Status: AC
Start: 2024-04-08 — End: 2024-04-08
  Filled 2024-04-08: qty 1

## 2024-04-08 MED ORDER — SUCCINYLCHOLINE CHLORIDE 200 MG/10ML IV SOSY
PREFILLED_SYRINGE | INTRAVENOUS | Status: DC | PRN
  Administered 2024-04-08: 120 mg via INTRAVENOUS

## 2024-04-08 MED ORDER — DIBUCAINE (PERIANAL) 1 % EX OINT
TOPICAL_OINTMENT | CUTANEOUS | Status: AC
  Filled 2024-04-08: qty 28

## 2024-04-08 MED ORDER — SODIUM CHLORIDE 0.9 % IR SOLN
Status: DC | PRN
  Administered 2024-04-08: 1000 mL

## 2024-04-08 MED ORDER — SODIUM CHLORIDE 0.9 % IV SOLN: 250.0000 mL | INTRAVENOUS | Status: DC | PRN

## 2024-04-08 MED ORDER — DEXMEDETOMIDINE HCL IN NACL 80 MCG/20ML IV SOLN
INTRAVENOUS | Status: DC | PRN
Start: 2024-04-08 — End: 2024-04-08
  Administered 2024-04-08: 4 ug via INTRAVENOUS
  Administered 2024-04-08: 8 ug via INTRAVENOUS

## 2024-04-08 MED ORDER — FENTANYL CITRATE PF 50 MCG/ML IJ SOSY
25.0000 ug | PREFILLED_SYRINGE | INTRAMUSCULAR | Status: DC | PRN
  Administered 2024-04-08: 25 ug via INTRAVENOUS
  Administered 2024-04-08: 50 ug via INTRAVENOUS
  Administered 2024-04-08: 25 ug via INTRAVENOUS

## 2024-04-08 MED ORDER — LABETALOL HCL 5 MG/ML IV SOLN
INTRAVENOUS | Status: DC | PRN
Start: 2024-04-08 — End: 2024-04-08
  Administered 2024-04-08: 10 mg via INTRAVENOUS

## 2024-04-08 MED ORDER — MIDAZOLAM HCL 5 MG/5ML IJ SOLN
INTRAMUSCULAR | Status: DC | PRN
  Administered 2024-04-08: 2 mg via INTRAVENOUS

## 2024-04-08 MED ORDER — ONDANSETRON HCL 4 MG/2ML IJ SOLN
INTRAMUSCULAR | Status: AC
  Filled 2024-04-08: qty 2

## 2024-04-08 MED ORDER — OXYCODONE HCL 5 MG PO TABS
5.0000 mg | ORAL_TABLET | Freq: Once | ORAL | Status: AC | PRN
  Administered 2024-04-08: 5 mg via ORAL

## 2024-04-08 MED ORDER — FENTANYL CITRATE PF 50 MCG/ML IJ SOSY
PREFILLED_SYRINGE | INTRAMUSCULAR | Status: AC
Start: 2024-04-08 — End: 2024-04-08
  Filled 2024-04-08: qty 2

## 2024-04-08 MED ORDER — CHLORHEXIDINE GLUCONATE 0.12 % MT SOLN: 15.0000 mL | Freq: Once | OROMUCOSAL | Status: AC

## 2024-04-08 MED ORDER — GABAPENTIN 300 MG PO CAPS
300.0000 mg | ORAL_CAPSULE | ORAL | Status: AC
  Administered 2024-04-08: 300 mg via ORAL
  Filled 2024-04-08: qty 1

## 2024-04-08 MED ORDER — DEXMEDETOMIDINE HCL IN NACL 80 MCG/20ML IV SOLN
INTRAVENOUS | Status: AC
  Filled 2024-04-08: qty 20

## 2024-04-08 MED ORDER — DEXAMETHASONE SODIUM PHOSPHATE 10 MG/ML IJ SOLN
INTRAMUSCULAR | Status: DC | PRN
Start: 2024-04-08 — End: 2024-04-08
  Administered 2024-04-08: 4 mg via INTRAVENOUS

## 2024-04-08 MED ORDER — MIDAZOLAM HCL 2 MG/2ML IJ SOLN
INTRAMUSCULAR | Status: AC
  Filled 2024-04-08: qty 2

## 2024-04-08 MED ORDER — CHLORHEXIDINE GLUCONATE CLOTH 2 % EX PADS: 6.0000 | MEDICATED_PAD | Freq: Once | CUTANEOUS | Status: DC

## 2024-04-08 MED ORDER — FENTANYL CITRATE (PF) 100 MCG/2ML IJ SOLN
INTRAMUSCULAR | Status: AC
Start: 2024-04-08 — End: 2024-04-08
  Filled 2024-04-08: qty 2

## 2024-04-08 MED ORDER — ACETAMINOPHEN 500 MG PO TABS
1000.0000 mg | ORAL_TABLET | ORAL | Status: AC
  Administered 2024-04-08: 1000 mg via ORAL
  Filled 2024-04-08: qty 2

## 2024-04-08 MED ORDER — FENTANYL CITRATE (PF) 100 MCG/2ML IJ SOLN
INTRAMUSCULAR | Status: DC | PRN
  Administered 2024-04-08 (×2): 50 ug via INTRAVENOUS

## 2024-04-08 MED ORDER — AMISULPRIDE (ANTIEMETIC) 5 MG/2ML IV SOLN: 10.0000 mg | Freq: Once | INTRAVENOUS | Status: DC | PRN

## 2024-04-08 MED ORDER — ONDANSETRON HCL 4 MG/2ML IJ SOLN
INTRAMUSCULAR | Status: DC | PRN
  Administered 2024-04-08: 4 mg via INTRAVENOUS

## 2024-04-08 MED ORDER — PROPOFOL 10 MG/ML IV BOLUS
INTRAVENOUS | Status: AC
  Filled 2024-04-08: qty 20

## 2024-04-08 MED ORDER — BUPIVACAINE-EPINEPHRINE 0.25% -1:200000 IJ SOLN
INTRAMUSCULAR | Status: DC | PRN
  Administered 2024-04-08: 50 mL

## 2024-04-08 MED ORDER — PROPOFOL 500 MG/50ML IV EMUL
INTRAVENOUS | Status: DC | PRN
  Administered 2024-04-08: 115 ug/kg/min via INTRAVENOUS

## 2024-04-08 MED ORDER — ROCURONIUM BROMIDE 10 MG/ML (PF) SYRINGE
PREFILLED_SYRINGE | INTRAVENOUS | Status: AC
  Filled 2024-04-08: qty 10

## 2024-04-08 MED ORDER — HYDROMORPHONE HCL 1 MG/ML IJ SOLN
INTRAMUSCULAR | Status: DC | PRN
  Administered 2024-04-08: .8 mg via INTRAVENOUS

## 2024-04-08 MED ORDER — OXYCODONE-ACETAMINOPHEN 10-325 MG PO TABS: 1.0000 | ORAL_TABLET | Freq: Four times a day (QID) | ORAL | 0 refills | Status: AC | PRN

## 2024-04-08 MED ORDER — LABETALOL HCL 5 MG/ML IV SOLN
INTRAVENOUS | Status: AC
  Filled 2024-04-08: qty 4

## 2024-04-08 MED ORDER — ORAL CARE MOUTH RINSE
15.0000 mL | Freq: Once | OROMUCOSAL | Status: AC
  Administered 2024-04-08: 15 mL via OROMUCOSAL

## 2024-04-08 MED ORDER — LACTATED RINGERS IV SOLN: INTRAVENOUS | Status: DC

## 2024-04-08 MED ORDER — PROPOFOL 1000 MG/100ML IV EMUL
INTRAVENOUS | Status: AC
Start: 2024-04-08 — End: 2024-04-08
  Filled 2024-04-08: qty 100

## 2024-04-08 MED ORDER — SODIUM CHLORIDE (PF) 0.9 % IJ SOLN
INTRAMUSCULAR | Status: AC
  Filled 2024-04-08: qty 10

## 2024-04-08 MED ORDER — ROCURONIUM BROMIDE 100 MG/10ML IV SOLN
INTRAVENOUS | Status: DC | PRN
  Administered 2024-04-08: 10 mg via INTRAVENOUS
  Administered 2024-04-08: 50 mg via INTRAVENOUS

## 2024-04-08 MED ORDER — BUPIVACAINE LIPOSOME 1.3 % IJ SUSP
INTRAMUSCULAR | Status: AC
  Filled 2024-04-08: qty 20

## 2024-04-08 MED ORDER — CELECOXIB 200 MG PO CAPS
200.0000 mg | ORAL_CAPSULE | ORAL | Status: AC
  Administered 2024-04-08: 200 mg via ORAL
  Filled 2024-04-08: qty 1

## 2024-04-08 MED ORDER — SODIUM CHLORIDE 0.9% FLUSH: 3.0000 mL | INTRAVENOUS | Status: DC | PRN

## 2024-04-08 MED ORDER — HYDROMORPHONE HCL 2 MG/ML IJ SOLN
INTRAMUSCULAR | Status: AC
  Filled 2024-04-08: qty 1

## 2024-04-08 MED ORDER — PROPOFOL 10 MG/ML IV BOLUS
INTRAVENOUS | Status: DC | PRN
  Administered 2024-04-08: 200 mg via INTRAVENOUS

## 2024-04-08 MED ORDER — ACETAMINOPHEN 500 MG PO TABS: 1000.0000 mg | ORAL_TABLET | Freq: Once | ORAL | Status: DC

## 2024-04-08 MED ORDER — OXYCODONE HCL 5 MG PO TABS
ORAL_TABLET | ORAL | Status: AC
  Filled 2024-04-08: qty 1

## 2024-04-08 MED ORDER — METHYLENE BLUE (ANTIDOTE) 1 % IV SOLN
INTRAVENOUS | Status: AC
  Filled 2024-04-08: qty 10

## 2024-04-08 MED ORDER — BUPIVACAINE LIPOSOME 1.3 % IJ SUSP: 20.0000 mL | Freq: Once | INTRAMUSCULAR | Status: DC

## 2024-04-08 MED ORDER — SUGAMMADEX SODIUM 200 MG/2ML IV SOLN
INTRAVENOUS | Status: AC
  Filled 2024-04-08: qty 2

## 2024-04-08 MED ORDER — BUPIVACAINE-EPINEPHRINE (PF) 0.25% -1:200000 IJ SOLN
INTRAMUSCULAR | Status: AC
Start: 2024-04-08 — End: 2024-04-08
  Filled 2024-04-08: qty 30

## 2024-04-08 MED ORDER — OXYCODONE HCL 5 MG/5ML PO SOLN: 5.0000 mg | Freq: Once | ORAL | Status: AC | PRN

## 2024-04-08 MED ORDER — LIDOCAINE HCL (CARDIAC) PF 100 MG/5ML IV SOSY
PREFILLED_SYRINGE | INTRAVENOUS | Status: DC | PRN
  Administered 2024-04-08: 60 mg via INTRAVENOUS

## 2024-04-08 MED ORDER — SODIUM CHLORIDE 0.9 % IV SOLN: 12.5000 mg | INTRAVENOUS | Status: DC | PRN

## 2024-04-08 MED ORDER — SODIUM CHLORIDE 0.9% FLUSH: 3.0000 mL | Freq: Two times a day (BID) | INTRAVENOUS | Status: DC

## 2024-04-08 MED ORDER — LIDOCAINE HCL (PF) 2 % IJ SOLN
INTRAMUSCULAR | Status: AC
  Filled 2024-04-08: qty 5

## 2024-04-08 MED ORDER — DEXAMETHASONE SODIUM PHOSPHATE 10 MG/ML IJ SOLN
INTRAMUSCULAR | Status: AC
  Filled 2024-04-08: qty 1

## 2024-04-08 MED ORDER — SUGAMMADEX SODIUM 200 MG/2ML IV SOLN
INTRAVENOUS | Status: DC | PRN
  Administered 2024-04-08: 240 mg via INTRAVENOUS

## 2024-04-08 SURGICAL SUPPLY — 38 items
BAG COUNTER SPONGE SURGICOUNT (BAG) IMPLANT
BENZOIN TINCTURE PRP APPL 2/3 (GAUZE/BANDAGES/DRESSINGS) ×1 IMPLANT
BLADE SURG 15 STRL LF DISP TIS (BLADE) ×1 IMPLANT
BLADE SURG SZ11 CARB STEEL (BLADE) ×1 IMPLANT
BRIEF MESH DISP LRG (UNDERPADS AND DIAPERS) ×1 IMPLANT
CNTNR URN SCR LID CUP LEK RST (MISCELLANEOUS) ×1 IMPLANT
COVER SURGICAL LIGHT HANDLE (MISCELLANEOUS) ×1 IMPLANT
DRAPE LAPAROTOMY T 102X78X121 (DRAPES) ×1 IMPLANT
ELECT NDL BLADE 2-5/6 (NEEDLE) IMPLANT
ELECT NEEDLE BLADE 2-5/6 (NEEDLE) IMPLANT
ELECT REM PT RETURN 15FT ADLT (MISCELLANEOUS) ×1 IMPLANT
GAUZE 4X4 16PLY ~~LOC~~+RFID DBL (SPONGE) ×1 IMPLANT
GAUZE PAD ABD 8X10 STRL (GAUZE/BANDAGES/DRESSINGS) ×1 IMPLANT
GAUZE SPONGE 4X4 12PLY STRL (GAUZE/BANDAGES/DRESSINGS) ×1 IMPLANT
GLOVE ECLIPSE 8.0 STRL XLNG CF (GLOVE) ×1 IMPLANT
GLOVE INDICATOR 8.0 STRL GRN (GLOVE) ×1 IMPLANT
GOWN STRL REUS W/ TWL XL LVL3 (GOWN DISPOSABLE) ×3 IMPLANT
KIT BASIN OR (CUSTOM PROCEDURE TRAY) ×1 IMPLANT
KIT TURNOVER KIT A (KITS) ×1 IMPLANT
LOOP VESSEL MAXI BLUE (MISCELLANEOUS) IMPLANT
NDL HYPO 22X1.5 SAFETY MO (MISCELLANEOUS) ×1 IMPLANT
NEEDLE HYPO 22X1.5 SAFETY MO (MISCELLANEOUS) ×1 IMPLANT
PACK BASIC VI WITH GOWN DISP (CUSTOM PROCEDURE TRAY) ×1 IMPLANT
PENCIL BUTTON HOLSTER BLD 10FT (ELECTRODE) ×1 IMPLANT
PENCIL SMOKE EVACUATOR (MISCELLANEOUS) IMPLANT
SCRUB CHG 4% DYNA-HEX 4OZ (MISCELLANEOUS) ×1 IMPLANT
SPIKE FLUID TRANSFER (MISCELLANEOUS) ×1 IMPLANT
SUCTION TUBE FRAZIER 12FR DISP (SUCTIONS) IMPLANT
SURGILUBE 2OZ TUBE FLIPTOP (MISCELLANEOUS) ×1 IMPLANT
SUT CHROMIC 2 0 SH (SUTURE) ×1 IMPLANT
SUT VIC AB 2-0 UR6 27 (SUTURE) ×6 IMPLANT
SWAB COLLECTION DEVICE MRSA (MISCELLANEOUS) IMPLANT
SWAB CULTURE ESWAB REG 1ML (MISCELLANEOUS) IMPLANT
SYR 20ML LL LF (SYRINGE) ×1 IMPLANT
SYR 3ML LL SCALE MARK (SYRINGE) IMPLANT
SYR BULB IRRIG 60ML STRL (SYRINGE) IMPLANT
TOWEL OR 17X26 10 PK STRL BLUE (TOWEL DISPOSABLE) ×1 IMPLANT
YANKAUER SUCT BULB TIP 10FT TU (MISCELLANEOUS) ×1 IMPLANT

## 2024-04-08 NOTE — Transfer of Care (Signed)
 Immediate Anesthesia Transfer of Care Note  Patient: Jonathon Warren  Procedure(s) Performed: SPHINCTEROTOMY, ANAL (Rectum) HEMORRHOIDECTOMY (Rectum) EXAM UNDER ANESTHESIA, RECTUM (Rectum)  Patient Location: PACU  Anesthesia Type:General  Level of Consciousness: drowsy  Airway & Oxygen Therapy: Patient Spontanous Breathing and Patient connected to face mask oxygen  Post-op Assessment: Report given to RN and Post -op Vital signs reviewed and stable  Post vital signs: Reviewed and stable  Last Vitals:  Vitals Value Taken Time  BP 128/92 04/08/24 14:30  Temp 36.4 C 04/08/24 14:13  Pulse 76 04/08/24 14:36  Resp 12 04/08/24 14:36  SpO2 100 % 04/08/24 14:36  Vitals shown include unfiled device data.  Last Pain:  Vitals:   04/08/24 1413  TempSrc:   PainSc: 0-No pain         Complications: No notable events documented.

## 2024-04-08 NOTE — Discharge Instructions (Signed)
 ##############################################  ANORECTAL SURGERY:  POST OPERATIVE INSTRUCTIONS  ######################################################################  EAT Start with a pureed / full liquid diet After 24 hours, gradually transition to a high fiber diet.    CONTROL PAIN Control pain so you can tolerate bowel movements,  walk, sleep, tolerate sneezing/coughing, and go up/down stairs.   HAVE A BOWEL MOVEMENT DAILY Keep your bowels regular to avoid problems.   Taking a fiber supplement 2-4 doses every day to keep bowels soft.   HAVE A BM BY THE SECOND POSTOPERATIVE DAY Use an antidairrheal to slow down diarrhea.   Call if not better after 2 tries  WALK Walk an hour a day.  Control your pain to do that.   CALL IF YOU HAVE PROBLEMS/CONCERNS Call if you are still struggling despite following these instructions. Call if you have concerns not answered by these instructions  ######################################################################    Take your usually prescribed home medications unless otherwise directed. Blood thinners:  You can restart any strong blood thinners after the second postoperative day  for example: COUMADIN (warfarin), XERELTO (rivaroxaban), ELIQUIS (apixaban), PLAVIX (clopidigrel), BRILINTA (ticagrelor), EFFIENT (prasugrel), PRADAXA (dabigatran), etc  Continue aspirin before & after surgery..     Some oozing/bleeding the first 1-2 weeks is common but should taper down & be small volume.    If you are passing many large clots or having uncontrolling bleeding, call your surgeon  DIET: Follow a light bland diet & liquids the first 24 hours after arrival home, such as soup, liquids, starches, etc.  Be sure to drink plenty of fluids.  Quickly advance to a usual solid diet within a few days.  Avoid fast food or heavy meals as your are more likely to get nauseated or have irregular bowels.  A low-fat, high-fiber diet for the rest of your life  is ideal.  Control pain to avoid problems with urinating or defecating:  Pain is best controlled by a usual combination of many methods TOGETHER: Warm baths/soaks or Ice packs Over the counter pain medication Prescription pain medications Topical creams  A.  Warm water baths or ice packs (30-60 minutes up to 8 times a day, especially after bowel meovements) will help.  Using ice for the first few days can help decrease swelling and bruising,  Use heat such as warm towels, sitz baths, warm baths, warm showers, etc to help relax tight/sore spots and speed recovery.   Some people prefer to use ice alone, heat alone, alternating between ice & heat.  Experiment to what works for you.    It is helpful to take an over-the-counter pain medication continuously for the first few weeks.  This helps people need to use less narcotics Choose one of the following that works best for you: Naproxen (Aleve, etc)  Two 220mg  tabs twice a day Ibuprofen (Advil, etc) Three 200mg  tabs four times a day (every meal & bedtime) Acetaminophen  (Tylenol , etc) 500-650mg  four times a day (every meal & bedtime)  A  prescription for pain medication (such as oxycodone , hydrocodone, etc) should be given to you upon discharge.  Take your pain medication as prescribed.  If you are having problems/concerns with the prescription medicine (does not control pain, nausea, vomiting, rash, itching, etc), please call us  [phone] to see if we need to switch you to a different pain medicine that will work better for you and/or control your side effect better. Most people will need 1-2 refills to get through the first couple of weeks when pain is the most  intense.  If you need a refill on your pain medication, please contact your pharmacy.  They will contact our office to request authorization. Prescriptions will not be filled after 5 pm or on week-ends.  If can take up to 48 hours for it to be filled & ready so avoid waiting until you  are down to thel ast pill.  A numbing topical cream (Dibucaine) may be given to you.  Many people find relief with topical creams.  Some people find it burns too much.  Experiment.  If it helps, use it.  If it burns, stop using it.  You also may receive a prescription for diazepam, a muscle relaxant to help you to be able to urinate at first easily.  It is safe to take a few doses with the other medications as long as you are not planning to drive or do anything intense.  Hopefully this can minimize the chance of needing a Foley catheter into your bladder     Have a bowel movement every day  Until you have a bowel movement after surgery, he will struggle with urinating and controlling her pain.  Take a fiber supplement (such as Metamucil, Citrucel, FiberCon, MiraLax, etc) 2-4 doses a day to help prevent constipation from occurring.     If you have not had a bowel movement the second postoperative day:  -switch to drinking liquids only -take MiraLAX double dose every 2 hours x 3 or until you have a BM -If that does not work x 3 doses, increase to 4 doses every 2 hours (Like a small bowel prep) until you have a bowel movement. -Avoid enemas or suppositories (can harm sutures/repair) -Once you start having bowel movements, adjust your fiber supplement or Miralax dosing back down until you are having 1-2 soft well formed BMs a day.  (Start at 2 doses a day & adjust up or down to avoid diarrhea or recurrent constipation)   Watch out for diarrhea.  If you have many loose bowel movements, simplify your diet to bland foods & liquids for a few days.  Stop any stool softeners and decrease your fiber supplement.  Switching to mild anti-diarrheal medications (Kayopectate, Pepto Bismol) can help.  Can try an imodium/loperamide dose.  If this worsens or does not improve, please call us .  Wound Care   a. You have some fluffed cotton on top of the anus to help catch drainage and bleeding.  THERE IS NO  PACKING INSIDE THE RECTUM -Let the cotton fall off with the first bowel movement or shower.  It is okay to reinforce or replace as needed.  Bleeding is common at first and gradually slows down after a few weeks   b. Place soft cotton balls on the anus/wounds and use an absorbent pad in your underwear as needed to catch any drainage and help keep the area.  Try to use cotton balls or pads (regular gauze or toilet paper will stick and pull, causing pain.  Cotton will come off more easily).  OK to try dry powders such as cornstarch or baby powders   c. Keep the area clean and dry.  Bathe / shower every day.  Keep the area clean by showering / bathing over the incision / wound.   It is okay to soak an open wound to help wash it.  Consider using a squeeze bottle filled with warm water to gently wash the anal area.  Wet wipes or showers / gentle washing after bowel movements  is often less traumatic than regular toilet paper.           D.  Use a Sitz Bath 4-8 times a day for relief  A sitz bath is a warm water bath taken in the sitting position that covers only the hips and buttocks. It may be used for either healing or hygiene purposes. Sitz baths are also used to relieve pain, itching, or muscle spasms.  Gently cleaned the area and the heat will help lower spasm and offer better pain control.    Fill the bathtub half full with warm water. Sit in the water and open the drain a little. Turn on the warm water to keep the tub half full. Keep the water running constantly. Soak in the water for 15 to 20 minutes. After the sitz bath, pat the affected area dry first.   d. You will often notice bleeding, especially with bowel movements.  This should slow down by the end of the first week of surgery.  It can take over a month to more fully stop.  Sitting on an ice pack can help.   e. Expect some drainage.  You often will have some blood or yellow drainage with open wounds.  Sometimes she will get a little leaking  of liquid stool until the incision/wounds have fully close down.  This should slow down by the end of the first week of surgery, but you will have occasional bleeding or drainage up to a few months after surgery until all incisions & wounds have closed.  Wear an absorbent pad or soft cotton gauze in your underwear until the drainage stops.  ACTIVITIES as tolerated:    You may resume regular (light) daily activities beginning the next day--such as daily self-care, walking, climbing stairs--gradually increasing activities as tolerated.  If you can walk 30 minutes without difficulty, it is safe to try more intense activity such as jogging, treadmill, bicycling, low-impact aerobics, swimming, etc. Save the most intensive and strenuous activity for last such as sit-ups, heavy lifting, contact sports, etc  Refrain from any heavy lifting or straining until you are off narcotics for pain control.   DO NOT PUSH THROUGH PAIN.  Let pain be your guide: If it hurts to do something, don't do it.  Pain is your body warning you to avoid that activity for another week until the pain goes down. You may drive when you are no longer taking prescription pain medication, you can comfortably sit for long periods of time, and you can safely maneuver your car and apply brakes. You may have sexual intercourse when it is comfortable.   6.  FOLLOW UP in our office Please call [facility] at [phone] to set up an appointment to see your surgeon in the office for a follow-up appointment approximately 3 weeks after your surgery. If tissue is sent for pathology, it usually takes a week for pathology results to come back.  We will make an effort to call you with results as soon as we get them.  If you have not heard back in a week and wish to know the results before your postop visit, please call our office and we will see if we can give feedback on the results Make sure that you call for this appointment the day you arrive home to  ensure a convenient appointment time.  7. IF YOU HAVE DISABILITY OR FAMILY LEAVE FORMS, BRING THEM TO THE OFFICE FOR PROCESSING.  DO NOT GIVE THEM TO YOUR  DOCTOR.        WHEN TO CALL US  [phone]: Poor pain control Reactions / problems with new medications (rash/itching, nausea, etc)  Fever over 101.5 F (38.5 C) Inability to urinate Nausea and/or vomiting Worsening swelling or bruising Continued bleeding from incision. Increased pain, redness, or drainage from the incision  The clinic staff is available to answer your questions during regular business hours (8:30am-5pm).  Please don't hesitate to call and ask to speak to one of our nurses for clinical concerns.   A surgeon from [facility] is always on call at the hospitals   If you have a medical emergency, go to the nearest emergency room or call 911.    [facility] [address], [place], [place]  [address] ? MAIN: [phone] ? TOLL FREE: [phone] ? FAX [phone] [email or web address]  #####################################################     WHAT IS AN ANAL FISSURE? An anal fissure (fissure-in-ano) is a small, oval shaped tear in skin that lines the opening of the anus. Fissures typically cause severe pain and bleeding with bowel movements. Fissures are quite common in the general population, but are often confused with other causes of pain and bleeding, such as hemorrhoids.  WHAT ARE THE SYMPTOMS OF AN ANAL FISSURE? The typical symptoms of an anal fissure include severe pain during, and especially after, a bowel movement, lasting from several minutes to a few hours. Patients may also notice bright red blood from the anus that can be seen on the toilet paper or on the stool. Between bowel movements, patients with anal fissures are often relatively symptom-free. Many patients are fearful of having a bowel movement and may try to avoid defecation  secondary to the pain.   WHAT CAUSES AN ANAL FISSURE? Fissures are usually caused by trauma to the inner lining of the anus. Patients with tight anal sphincter muscles (i.e., increased muscle tone) are more prone to developing anal fissures. A hard, dry bowel movement is typically responsible, but loose stools and diarrhea can also be the cause. Following a bowel movement, severe anal pain can produce spasm of the anal sphincter muscle, resulting in a decrease in blood flow to the site of the injury, thus impairing healing of the wound. The next bowel movement results in more pain, anal spasm, decreased blood flow to the area, and the cycle continues. Treatments are aimed at interrupting this cycle by relaxing the anal sphincter muscle to promote healing of the fissure.   Other, less common, causes include inflammatory conditions and certain anal infections or tumors. Anal fissures may be acute (recent onset) or chronic (present for a long period of time). Chronic fissures may be more difficult to treat, and may also have an external lump associated with the tear, called a sentinel pile or skin tag, as well as extra tissue just inside the anal canal (hypertrophied papilla) .  WHAT IS THE TREATMENT OF ANAL FISSURES? The majority of anal fissures do not require surgery. The most common treatment for an acute anal fissure consists of making the stool more formed and bulky with a diet high in fiber and utilization of over-the-counter fiber supplementation (totaling 25-35 grams of fiber/day). Stool softeners and increasing water intake may be necessary to promote soft bowel movements and aid in the healing process. Topical anesthetics for pain and warm tub baths (sitz baths) for 10-20 minutes several times a day (especially after bowel movements) are soothing and promote relaxation of the anal muscles, which  may help the healing process.  Other medications (such as diltiazem) may be prescribed that allow  relaxation of the anal sphincter muscles. Your surgeon will go over benefits and side-effects of each of these with you. Narcotic pain medications are not recommended for anal fissures, as they promote constipation. Chronic fissures are generally more difficult to treat, and your surgeon may advise surgical treatment.  WILL THE PROBLEM RETURN? Fissures can recur easily, and it is quite common for a fully healed fissure to recur after a hard bowel movement or other trauma. Even when the pain and bleeding have subsided, it is very important to continue good bowel habits and a diet high in fiber as a lifestyle change. If the problem returns without an obvious cause, further assessment is warranted.  GETTING TO GOOD BOWEL HEALTH. Irregular bowel habits such as constipation and diarrhea can lead to many problems over time.  Having one soft bowel movement a day is the most important way to prevent further problems.  The anorectal canal is designed to handle stretching and feces to safely manage our ability to get rid of solid waste (feces, poop, stool) out of our body.  BUT, hard constipated stools can act like ripping concrete bricks and diarrhea can be a burning fire to this very sensitive area of our body, causing inflamed hemorrhoids, anal fissures, increasing risk is perirectal abscesses, abdominal pain/bloating, an making irritable bowel worse.     The goal: ONE SOFT BOWEL MOVEMENT A DAY!  To have soft, regular bowel movements:  Drink at least 8 tall glasses of water a day.   Take plenty of fiber.  Fiber is the undigested part of plant food that passes into the colon, acting s "natures broom" to encourage bowel motility and movement.  Fiber can absorb and hold large amounts of water. This results in a larger, bulkier stool, which is soft and easier to pass. Work gradually over several weeks up to 6 servings a day of fiber (25g a day even more if needed) in the form of: Vegetables -- Root (potatoes,  carrots, turnips), leafy green (lettuce, salad greens, celery, spinach), or cooked high residue (cabbage, broccoli, etc) Fruit -- Fresh (unpeeled skin & pulp), Dried (prunes, apricots, cherries, etc ),  or stewed ( applesauce)  Whole grain breads, pasta, etc (whole wheat)  Bran cereals  Bulking Agents -- This type of water-retaining fiber generally is easily obtained each day by one of the following:  Psyllium bran -- The psyllium plant is remarkable because its ground seeds can retain so much water. This product is available as Metamucil, Konsyl, Effersyllium, Per Diem Fiber, or the less expensive generic preparation in drug and health food stores. Although labeled a laxative, it really is not a laxative.  Methylcellulose -- This is another fiber derived from wood which also retains water. It is available as Citrucel. Polyethylene Glycol - and "artificial" fiber commonly called Miralax or Glycolax.  It is helpful for people with gassy or bloated feelings with regular fiber Flax Seed - a less gassy fiber than psyllium No reading or other relaxing activity while on the toilet. If bowel movements take longer than 5 minutes, you are too constipated AVOID CONSTIPATION.  High fiber and water intake usually takes care of this.  Sometimes a laxative is needed to stimulate more frequent bowel movements, but  Laxatives are not a good long-term solution as it can wear the colon out. Osmotics (Milk of Magnesia, Fleets phosphosoda, Magnesium  citrate, MiraLax, GoLytely) are safer  than  Stimulants (Senokot, Castor Oil, Dulcolax, Ex Lax)    Do not take laxatives for more than 7days in a row.  IF SEVERELY CONSTIPATED, try a Bowel Retraining Program: Do not use laxatives.  Eat a diet high in roughage, such as bran cereals and leafy vegetables.  Drink six (6) ounces of prune or apricot juice each morning.  Eat two (2) large servings of stewed fruit each day.  Take one (1) heaping tablespoon of a psyllium-based  bulking agent twice a day. Use sugar-free sweetener when possible to avoid excessive calories.  Eat a normal breakfast.  Set aside 15 minutes after breakfast to sit on the toilet, but do not strain to have a bowel movement.  If you do not have a bowel movement by the third day, use an enema and repeat the above steps.  Controlling diarrhea Switch to liquids and simpler foods for a few days to avoid stressing your intestines further. Avoid dairy products (especially milk & ice cream) for a short time.  The intestines often can lose the ability to digest lactose when stressed. Avoid foods that cause gassiness or bloating.  Typical foods include beans and other legumes, cabbage, broccoli, and dairy foods.  Every person has some sensitivity to other foods, so listen to our body and avoid those foods that trigger problems for you. Adding fiber (Citrucel, Metamucil, psyllium, Miralax) gradually can help thicken stools by absorbing excess fluid and retrain the intestines to act more normally.  Slowly increase the dose over a few weeks.  Too much fiber too soon can backfire and cause cramping & bloating. Probiotics (such as active yogurt, Align, etc) may help repopulate the intestines and colon with normal bacteria and calm down a sensitive digestive tract.  Most studies show it to be of mild help, though, and such products can be costly. Medicines: Bismuth subsalicylate (ex. Kayopectate, Pepto Bismol) every 30 minutes for up to 6 doses can help control diarrhea.  Avoid if pregnant. Loperamide (Immodium) can slow down diarrhea.  Start with two tablets (4mg  total) first and then try one tablet every 6 hours.  Avoid if you are having fevers or severe pain.  If you are not better or start feeling worse, stop all medicines and call your doctor for advice Call your doctor if you are getting worse or not better.  Sometimes further testing (cultures, endoscopy, X-ray studies, bloodwork, etc) may be needed to help  diagnose and treat the cause of the diarrhea.   WHAT CAN BE DONE IF THE FISSURE DOES NOT HEAL? A fissure that fails to respond to conservative measures should be re-examined. Persistent hard or loose bowel movements, scarring, or spasm of the internal anal muscle all contribute to delayed healing. Other medical problems such as inflammatory bowel disease (Crohn's disease), infections, or anal tumors can cause symptoms similar to anal fissures. Patients suffering from persistent anal pain should be examined to exclude these symptoms. This may include a colonoscopy or an exam in the operating room under anesthesia.  WHAT DOES SURGERY INVOLVE? Surgical options for treating anal fissure include Botulinum toxin (Botox) injection into the anal sphincter and surgical division of a portion of the internal anal sphincter (lateral internal sphincterotomy). Both of these are performed typically as outpatient, same-day procedures, or occasionally in the office setting. The goal of these surgical options is to promote relaxation of the anal sphincter, thereby decreasing anal pain and spasm, allowing the fissure to heal. Botox injection results in healing in 50-80% of  patients, while sphincterotomy is reported to be over 90% successful. If a sentinel pile is present, it may be removed to promote healing of the fissure. All surgical procedures carry some risk, and a sphincterotomy can rarely interfere with one's ability to control gas and stool. Your colon and rectal surgeon will discuss these risks with you to determine the appropriate treatment for your particular situation.  HOW LONG IS THE RECOVERY AFTER SURGERY? It is important to note that complete healing with both medical and surgical treatments can take up to approximately 6-10 weeks. However, acute pain after surgery often disappears after a few days. Most patients will be able to return to work and resume daily activities in a few short days after the  surgery.  CAN FISSURES LEAD TO COLON CANCER? Absolutely not. Persistent symptoms, however, need careful evaluation since other conditions other than an anal fissure can cause similar symptoms. Your colon and rectal surgeon may request additional tests, even if your fissure has successfully healed. A colonoscopy may be required to exclude other causes of rectal bleeding.

## 2024-04-08 NOTE — Anesthesia Procedure Notes (Signed)
 Procedure Name: Intubation Date/Time: 04/08/2024 12:45 PM  Performed by: Landy Chip HERO, CRNAPre-anesthesia Checklist: Patient identified, Emergency Drugs available, Suction available and Patient being monitored Patient Re-evaluated:Patient Re-evaluated prior to induction Oxygen Delivery Method: Circle System Utilized Preoxygenation: Pre-oxygenation with 100% oxygen Induction Type: IV induction Ventilation: Mask ventilation without difficulty Laryngoscope Size: Glidescope and 4 Grade View: Grade II Tube type: Oral Tube size: 7.5 mm Number of attempts: 1 Airway Equipment and Method: Rigid stylet Placement Confirmation: ETT inserted through vocal cords under direct vision, positive ETCO2 and breath sounds checked- equal and bilateral Secured at: 23 cm Tube secured with: Tape Dental Injury: Teeth and Oropharynx as per pre-operative assessment

## 2024-04-08 NOTE — Interval H&P Note (Signed)
 History and Physical Interval Note:  04/08/2024 11:43 AM  Jonathon Warren  has presented today for surgery, with the diagnosis of ANAL FISSURE REFRACTORY TO MEDICAL MANAGEMENT.  The various methods of treatment have been discussed with the patient and family. After consideration of risks, benefits and other options for treatment, the patient has consented to  Procedure(s): SPHINCTEROTOMY, ANAL (N/A) HEMORRHOIDECTOMY (N/A) EXAM UNDER ANESTHESIA, RECTUM (N/A) as a surgical intervention.  The patient's history has been reviewed, patient examined, no change in status, stable for surgery.  I have reviewed the patient's chart and labs.  Questions were answered to the patient's satisfaction.    I have re-reviewed the the patient's records, history, medications, and allergies.  I have re-examined the patient.  I again discussed intraoperative plans and goals of post-operative recovery.  The patient agrees to proceed.  Jonathon Warren  1981-02-23 [id number]  Patient Care Team: Pcp, No as PCP - Diedre Sheldon Standing, MD as Consulting Physician (General Surgery) [facility]  Patient Active Problem List   Diagnosis Date Noted   Bipolar 1 disorder, depressed, moderate (HCC) 08/30/2021   Generalized anxiety disorder 08/30/2021   Bipolar I disorder, most recent episode mixed (HCC) 08/23/2021   Bipolar affective disorder, current episode mixed, without psychotic features (HCC) 08/23/2021    Past Medical History:  Diagnosis Date   Anxiety    Bipolar 1 disorder (HCC)    Depression    Diabetes mellitus without complication (HCC)    Hypertension     Past Surgical History:  Procedure Laterality Date   APPENDECTOMY     ARTHROSCOPIC REPAIR ACL Left    BOWEL RESECTION  2024   CHOLECYSTECTOMY     COLONOSCOPY     SHOULDER ARTHROSCOPY Left    bone spurs    Social History   Socioeconomic History   Marital status: Married    Spouse name: Not on file   Number of children: Not on  file   Years of education: Not on file   Highest education level: Not on file  Occupational History   Not on file  Tobacco Use   Smoking status: Unknown   Smokeless tobacco: Not on file  Vaping Use   Vaping status: Never Used  Substance and Sexual Activity   Alcohol use: Never   Drug use: Never   Sexual activity: Not on file  Other Topics Concern   Not on file  Social History Narrative   Not on file   Social Drivers of Health   Financial Resource Strain: Not on file  Food Insecurity: Not on file  Transportation Needs: Not on file  Physical Activity: Not on file  Stress: Not on file  Social Connections: Not on file  Intimate Partner Violence: Not on file    No family history on file.  Medications Prior to Admission  Medication Sig Dispense Refill Last Dose/Taking   busPIRone  (BUSPAR ) 10 MG tablet Take 1 tablet (10 mg total) by mouth 2 (two) times daily as needed (For anxiety). 60 tablet 0 Taking As Needed   clonazePAM  (KLONOPIN ) 1 MG tablet Take 1 tablet (1 mg total) by mouth in the morning and at bedtime. 30 tablet 0 Taking   FIBER GUMMIES PO Take 2 tablets by mouth daily.   Taking   lamoTRIgine  (LAMICTAL ) 100 MG tablet Take 100 mg by mouth daily.   Taking   loperamide (IMODIUM A-D) 2 MG tablet Take 4 mg by mouth in the morning and at bedtime.   Taking  losartan -hydrochlorothiazide  (HYZAAR) 100-25 MG tablet Take 1 tablet by mouth daily.   Taking   melatonin 5 MG TABS Take 5-10 mg by mouth at bedtime.   Taking   metFORMIN  (GLUCOPHAGE ) 1000 MG tablet Take 1,000 mg by mouth at bedtime.   Taking   QUEtiapine  (SEROQUEL ) 25 MG tablet Take 25 mg by mouth at bedtime.   Taking   risperiDONE  (RISPERDAL ) 2 MG tablet Take 2 mg by mouth at bedtime.   Taking   hydrOXYzine  (VISTARIL ) 25 MG capsule Take 1 capsule (25 mg total) by mouth 3 (three) times daily as needed. (Patient not taking: Reported on 03/25/2024) 60 capsule 0 Not Taking   lamoTRIgine  50 MG TBDP Take 1 tablet (50 mg total)  by mouth at bedtime. (Patient not taking: Reported on 03/25/2024) 30 tablet 1 Not Taking   PARoxetine  (PAXIL ) 20 MG tablet Take 1 tablet (20 mg total) by mouth in the morning and at bedtime. (Patient not taking: Reported on 03/25/2024) 60 tablet 0 Not Taking   risperiDONE  (RISPERDAL ) 1 MG tablet Take 2 tablets (2 mg total) by mouth at bedtime. (Patient not taking: Reported on 03/25/2024) 60 tablet 0 Not Taking   traZODone  (DESYREL ) 100 MG tablet Take 2 tablets (200 mg total) by mouth at bedtime. (Patient not taking: Reported on 03/25/2024) 60 tablet 1 Not Taking    Current Facility-Administered Medications  Medication Dose Route Frequency Provider Last Rate Last Admin   acetaminophen  (TYLENOL ) tablet 1,000 mg  1,000 mg Oral Once Lucious Debby BRAVO, MD       acetaminophen  (TYLENOL ) tablet 1,000 mg  1,000 mg Oral On Call to OR Sheldon Standing, MD       bupivacaine  liposome (EXPAREL ) 1.3 % injection 266 mg  20 mL Infiltration Once Sheldon Standing, MD       celecoxib  (CELEBREX ) capsule 200 mg  200 mg Oral On Call to OR Sheldon Standing, MD       chlorhexidine  (PERIDEX ) 0.12 % solution 15 mL  15 mL Mouth/Throat Once Ellender, Bernardino SQUIBB, MD       Or   Oral care mouth rinse  15 mL Mouth Rinse Once Ellender, Bernardino SQUIBB, MD       Chlorhexidine  Gluconate Cloth 2 % PADS 6 each  6 each Topical Once Sheldon Standing, MD       And   Chlorhexidine  Gluconate Cloth 2 % PADS 6 each  6 each Topical Once Sheldon Standing, MD       gabapentin  (NEURONTIN ) capsule 300 mg  300 mg Oral On Call to OR Sheldon Standing, MD       lactated ringers  infusion   Intravenous Continuous Ellender, Bernardino SQUIBB, MD         Allergies  Allergen Reactions   Lisinopril Other (See Comments)    Makes me have to clear my throat a lot    BP (!) 133/98   Pulse (!) 109   Temp 98.1 F (36.7 C) (Oral)   Resp 18   SpO2 98%   Labs: Results for orders placed or performed during the hospital encounter of 04/08/24 (from the past 48 hours)  Glucose, capillary      Status: Abnormal   Collection Time: 04/08/24 11:18 AM  Result Value Ref Range   Glucose-Capillary 165 (H) 70 - 99 mg/dL    Comment: Glucose reference range applies only to samples taken after fasting for at least 8 hours.    Imaging / Studies: No results found.   Briant KYM Sheldon, M.D.,  F.A.C.S. Gastrointestinal and Minimally Invasive Surgery [facility] [address] [place], [place] [address] [phone] Main / Paging  04/08/2024 11:43 AM    Jonathon Warren

## 2024-04-08 NOTE — Anesthesia Postprocedure Evaluation (Signed)
 Anesthesia Post Note  Patient: Jonathon Warren  Procedure(s) Performed: SPHINCTEROTOMY, ANAL (Rectum) HEMORRHOIDECTOMY (Rectum) EXAM UNDER ANESTHESIA, RECTUM (Rectum)     Patient location during evaluation: PACU Anesthesia Type: General Level of consciousness: awake and alert Pain management: pain level controlled Vital Signs Assessment: post-procedure vital signs reviewed and stable Respiratory status: spontaneous breathing, nonlabored ventilation and respiratory function stable Cardiovascular status: stable and blood pressure returned to baseline Anesthetic complications: no   No notable events documented.  Last Vitals:  Vitals:   04/08/24 1500 04/08/24 1515  BP: (!) 136/95 (!) 137/92  Pulse: 78 73  Resp: (!) 9 11  Temp:    SpO2: 96% 90%    Last Pain:  Vitals:   04/08/24 1515  TempSrc:   PainSc: 4                  Debby FORBES Like

## 2024-04-08 NOTE — Op Note (Signed)
 04/08/2024  1:57 PM  PATIENT:  Jonathon Warren  43 y.o. male  Patient Care Team: Pcp, No as PCP - Diedre Sheldon Standing, MD as Consulting Physician (General Surgery) [facility]  PRE-OPERATIVE DIAGNOSIS:  HEMORRHOID - INTERNAL GRADE 2 PROLAPSING and ANAL FISSURE  POST-OPERATIVE DIAGNOSIS:  HEMORRHOID - INTERNAL GRADE 2 PROLAPSING and ANAL FISSURE  PROCEDURE:  ANAL SPHINCTEROTOMY - internal sphincter - partial  HEMORRHOIDAL LIGATION & HEMORRHOIDOPEXY ANORECTAL EXAMINATION UNDER ANESTHESIA  SURGEON:  Standing KYM Sheldon, MD  ASSISTANT:  (n/a)   ANESTHESIA:  General endotracheal intubation anesthesia (GETA) and Anorectal and field block for perioperative & postoperative pain control provided with liposomal bupivacaine  (Experel) 20mL mixed with 30mL of bupivicaine 0.25% with epinephrine   Estimated Blood Loss (EBL):   Total I/O In: -  Out: 20 [Blood:20].   (See anesthesia record)  Delay start of Pharmacological VTE agent (>24hrs) due to concerns of significant anemia, surgical blood loss, or risk of bleeding?:  no  DRAINS: (None)  SPECIMEN:  Anal fissure   DISPOSITION OF SPECIMEN:  Pathology  COUNTS:  Sponge, needle, & instrument counts CORRECT at the conclusion of the case.      PLAN OF CARE: Discharge to home after PACU  PATIENT DISPOSITION:  PACU - hemodynamically stable.  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  I recommended examination and surgical treatment:  The anatomy & physiology of the anorectal region was discussed.  The pathophysiology of anal fissure and differential diagnosis was discussed.  Natural history progression  was discussed.   I stressed the importance of a bowel regimen to have daily soft bowel movements to minimize progression of disease.     The patient's condition is not adequately controlled.  Non-operative treatment has not healed the fissure.  Therefore, I  recommended examination under anesthesia for better examination to confirm the diagnosis and treat by lateral internal sphincterotomy to relax the spasm better & allow the fissure to heal.  Technique, benefits, alternatives were discussed.   I noted a good likelihood this will help address the problem.  Risks such as bleeding, pain, incontinence, recurrence, heart attack, death, and other risks were discussed.    Educational handouts further explaining the pathology, treatment options, and bowel regimen were given as well.  The patient expressed understanding & wishes to proceed with surgery.  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  Sphincterotomy location:  Left lateral anal canal.  50% distal internal sphincterotomy performed  Grade 2 internal hemorrhoids noted left lateral greater than right posterior greater than right anterior.  Ligation/pexy done.  Held off on hemorrhoidectomy given anal fissure excision and closure  IDESCRIPTION:   Informed consent was confirmed. Patient underwent general anesthesia without difficulty. Patient was placed into prone/jacknife positioning.  The perianal region was prepped and draped in sterile fashion. Surgical timeout confirmed or plan.  I did digital rectal examination and then transitioned over to anoscopy to get a sense of the anatomy.  I identified an anal fissure in the left posterior midline anal canal.  The sphincter tone was increased.  No stricture.  No abscess located.  No fistula.  Swollen internal grade 2 hemorrhoids on all 3 piles.  I went ahead and proceeded with internal sphinterotmy technique.  Carefully placed a Parks anal retractor for good visualization and gentle dilation.  I used a 2-0 Vicryl and did a figure-of-eight suture  six centimeters proximal to the anal verge in the left lateral column.  I ran that distally in an interrupted fashion.  I  then came through the anoderm of the left lateral anal canal & hemorrhoidal pile longitudinally.  I identified the internal and external sphincters.  Elevating the internal sphincter, I noted it was thickened and hypertensive..  I proceeded with a partial internal sphincterotomy starting distally and moving proximally using cautery.  This involved the distal and superficial parts of the sphincter to transected it down to 50%.  This provided improved relaxation of the anal sphincter.  I closed the anal canal wound vertically using 2-0 Vicryl suture to the anal verge.  Patient had enlarged grade 2 hemorrhoids so I ended up doing further longitudinal ligation and pexy using 2-0 Vicryl interrupted running sutures on the other 5 remaining columns in the classic hexagonal pattern (right posterior/lateral/anterior, left posterior/lateral/left anterior) down to the anorectal margin.  He had some mild fullness in right lateral and left lateral aspects but nothing too severe.  I excised the left posterior fibrotic chronic anal fissure with a small sentinel tag to healthy tissue.  Since there was some redundancy at the anorectal margin I decided to close the resulting defect with interrupted horizontal mattress 2-0 chromic suture longitudinally to good result over a large Sawyer retractor.    I reexamined the anal canal.   There is was no narrowing.  Hemostasis was excellent.  I repeated anoscopy and examination.  Hemostasis was good.  Patient is being extubated go to recovery room.  I discussed postop care in the holding area with the patient.  Instructions are written.  I made an attempt to reach the patient's mother per the patient's request.  No one is available at this time.  My plans & instructions have been written in the chart.    Elspeth KYM Schultze, M.D., F.A.C.S. Gastrointestinal and Minimally Invasive Surgery [facility] [address] [place], [place] [address] [phone] Main / Paging

## 2024-04-08 NOTE — Anesthesia Preprocedure Evaluation (Addendum)
 Anesthesia Evaluation  Patient identified by MRN, date of birth, ID band Patient awake    Reviewed: Allergy & Precautions, NPO status , Patient's Chart, lab work & pertinent test results  History of Anesthesia Complications Negative for: history of anesthetic complications  Airway Mallampati: II  TM Distance: >3 FB Neck ROM: Full    Dental  (+) Dental Advisory Given, Teeth Intact   Pulmonary neg pulmonary ROS   Pulmonary exam normal        Cardiovascular hypertension, Pt. on medications Normal cardiovascular exam     Neuro/Psych  PSYCHIATRIC DISORDERS Anxiety Depression Bipolar Disorder   negative neurological ROS     GI/Hepatic negative GI ROS, Neg liver ROS,,,  Endo/Other  diabetes, Type 2, Oral Hypoglycemic Agents   Obesity   Renal/GU negative Renal ROS     Musculoskeletal negative musculoskeletal ROS (+)    Abdominal   Peds  Hematology negative hematology ROS (+)   Anesthesia Other Findings   Reproductive/Obstetrics                              Anesthesia Physical Anesthesia Plan  ASA: 2  Anesthesia Plan: General   Post-op Pain Management: Tylenol  PO (pre-op)*   Induction: Intravenous  PONV Risk Score and Plan: 2 and Treatment may vary due to age or medical condition, Ondansetron , Dexamethasone  and Midazolam   Airway Management Planned: Oral ETT  Additional Equipment: None  Intra-op Plan:   Post-operative Plan: Extubation in OR  Informed Consent: I have reviewed the patients History and Physical, chart, labs and discussed the procedure including the risks, benefits and alternatives for the proposed anesthesia with the patient or authorized representative who has indicated his/her understanding and acceptance.     Dental advisory given  Plan Discussed with: CRNA and Anesthesiologist  Anesthesia Plan Comments:          Anesthesia Quick Evaluation
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
## 2024-04-09 ENCOUNTER — Encounter (HOSPITAL_COMMUNITY): Payer: Self-pay | Admitting: Surgery

## 2024-04-10 LAB — SURGICAL PATHOLOGY
